# Patient Record
Sex: Male | Born: 1995 | Race: Black or African American | Hispanic: No | Marital: Single | Smoking: Current every day smoker
Health system: Southern US, Community
[De-identification: ages and names within clinical notes are randomized; demographics above are authoritative.]

## PROBLEM LIST (undated history)

## (undated) DIAGNOSIS — R569 Unspecified convulsions: Secondary | ICD-10-CM

---
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
## 2016-02-08 ENCOUNTER — Emergency Department (HOSPITAL_COMMUNITY): Admission: EM | Admit: 2016-02-08 | Discharge: 2016-02-08 | Discharge status: Against Medical Advice | Payer: Self-pay

## 2016-02-08 NOTE — ED Triage Notes (Signed)
No answer x3

## 2016-02-08 NOTE — ED Notes (Signed)
Pt called in waiting room for triage and did not answer

## 2016-03-22 ENCOUNTER — Encounter (HOSPITAL_COMMUNITY): Payer: Self-pay | Admitting: *Deleted

## 2016-03-22 ENCOUNTER — Emergency Department (HOSPITAL_COMMUNITY)
Admission: EM | Admit: 2016-03-22 | Discharge: 2016-03-22 | Disposition: A | Discharge status: Home / Self Care | Payer: Self-pay | Attending: Emergency Medicine | Admitting: Emergency Medicine

## 2016-03-22 DIAGNOSIS — R51 Headache: Secondary | ICD-10-CM | POA: Insufficient documentation

## 2016-03-22 DIAGNOSIS — F17200 Nicotine dependence, unspecified, uncomplicated: Secondary | ICD-10-CM | POA: Insufficient documentation

## 2016-03-22 DIAGNOSIS — R519 Headache, unspecified: Secondary | ICD-10-CM

## 2016-03-22 DIAGNOSIS — K047 Periapical abscess without sinus: Secondary | ICD-10-CM

## 2016-03-22 MED ORDER — PENICILLIN V POTASSIUM 250 MG PO TABS: 500.0000 mg | ORAL_TABLET | Freq: Four times a day (QID) | ORAL | 0 refills | Status: AC

## 2016-03-22 MED ORDER — KETOROLAC TROMETHAMINE 30 MG/ML IJ SOLN
30.0000 mg | Freq: Once | INTRAMUSCULAR | Status: AC
  Administered 2016-03-22: 30 mg via INTRAVENOUS
  Filled 2016-03-22: qty 1

## 2016-03-22 MED ORDER — PENICILLIN V POTASSIUM 250 MG PO TABS
500.0000 mg | ORAL_TABLET | Freq: Once | ORAL | Status: AC
  Administered 2016-03-22: 500 mg via ORAL
  Filled 2016-03-22: qty 2

## 2016-03-22 MED ORDER — METOCLOPRAMIDE HCL 5 MG/ML IJ SOLN
10.0000 mg | Freq: Once | INTRAMUSCULAR | Status: AC
  Administered 2016-03-22: 10 mg via INTRAVENOUS
  Filled 2016-03-22: qty 2

## 2016-03-22 MED ORDER — SODIUM CHLORIDE 0.9 % IV BOLUS (SEPSIS)
1000.0000 mL | Freq: Once | INTRAVENOUS | Status: AC
  Administered 2016-03-22: 1000 mL via INTRAVENOUS

## 2016-03-22 MED ORDER — DIPHENHYDRAMINE HCL 50 MG/ML IJ SOLN
12.5000 mg | Freq: Once | INTRAMUSCULAR | Status: AC
  Administered 2016-03-22: 12.5 mg via INTRAVENOUS
  Filled 2016-03-22: qty 1

## 2016-03-22 NOTE — ED Provider Notes (Signed)
[facility] Provider Note   CSN: [id number] Arrival date & time: 03/22/16  0016     History   Chief Complaint Chief Complaint  Patient presents with  . Headache    HPI Brett Vazquez is a 21 y.o. male.  The history is provided by the patient and medical records.  Headache   Associated symptoms include nausea and vomiting. Pertinent negatives include no fever and no shortness of breath.   Brett Vazquez is a 21 y.o. male  with a PMH of seizures who presents to the Emergency Department complaining of gradually worsening headache that began around 3-4 pm associated with nausea and four episodes of emesis. Patient states that he has had a left lower tooth ache x 2-3 days as well. He tried to take a leftover antibiotic today, but threw it up shortly after. No other medications prior to arrival.  No shortness of breath, difficulty swallowing, fever/chills, neck pain, visual changes, muscle weakness. No head injury.   Past Medical History:  Diagnosis Date  . Seizures (HCC)     There are no active problems to display for this patient.   History reviewed. No pertinent surgical history.     Home Medications    Prior to Admission medications   Medication Sig Start Date End Date Taking? Authorizing Provider  acetaminophen (TYLENOL) 160 MG/5ML liquid Take 325 mg by mouth every 4 (four) hours as needed for fever.    Historical Provider, MD  cetirizine (ZYRTEC ALLERGY) 10 MG tablet Take 1 tablet (10 mg total) by mouth daily. 04/04/15   Everlene FarrierWilliam Dansie, PA-C  cyclobenzaprine (FLEXERIL) 5 MG tablet 1 tab po q8h prn muscle pain 11/17/13   Viviano SimasLauren Robinson, NP  diazepam (DIASTAT ACUDIAL) 10 MG GEL Place 10 mg rectally once. For seizure lasting more than 3 minutes 07/06/14   Ree ShayJamie Deis, MD  ibuprofen (ADVIL,MOTRIN) 600 MG tablet Take 1 tablet (600 mg total) by mouth every 6 (six) hours as needed. Patient taking differently: Take 600 mg by mouth every 6 (six) hours as needed for  moderate pain.  12/25/14   Eyvonne MechanicJeffrey Hedges, PA-C  naproxen (NAPROSYN) 250 MG tablet Take 1 tablet (250 mg total) by mouth 2 (two) times daily with a meal. 04/04/15   Everlene FarrierWilliam Dansie, PA-C  ondansetron (ZOFRAN ODT) 4 MG disintegrating tablet Take 1 tablet (4 mg total) by mouth every 8 (eight) hours as needed for nausea or vomiting. 04/04/15   Everlene FarrierWilliam Dansie, PA-C  oseltamivir (TAMIFLU) 75 MG capsule Take 1 capsule (75 mg total) by mouth every 12 (twelve) hours. 04/04/15   Everlene FarrierWilliam Dansie, PA-C  pantoprazole (PROTONIX) 20 MG tablet Take 1 tablet (20 mg total) by mouth daily. 07/10/15   Samantha Tripp Dowless, PA-C  penicillin v potassium (VEETID) 250 MG tablet Take 2 tablets (500 mg total) by mouth 4 (four) times daily. 03/22/16 03/29/16  Chase PicketJaime Pilcher Kavina Cantave, PA-C    Family History No family history on file.  Social History Social History  Substance Use Topics  . Smoking status: Current Every Day Smoker  . Smokeless tobacco: Never Used  . Alcohol use No     Allergies   Patient has no known allergies.   Review of Systems Review of Systems  Constitutional: Negative for chills and fever.  HENT: Positive for dental problem. Negative for congestion, sore throat and trouble swallowing.   Eyes: Negative for visual disturbance.  Respiratory: Negative for cough and shortness of breath.   Cardiovascular: Negative.   Gastrointestinal: Positive for nausea  and vomiting. Negative for abdominal pain, blood in stool, constipation and diarrhea.  Genitourinary: Negative for dysuria.  Musculoskeletal: Negative for back pain and neck pain.  Skin: Negative for rash.  Neurological: Positive for headaches. Negative for syncope and weakness.     Physical Exam Updated Vital Signs BP 112/68 (BP Location: Left Arm)   Pulse 81   Temp 98.3 F (36.8 C) (Oral)   Resp 16   Ht 5\' 11"  (1.803 m)   Wt 54.4 kg   SpO2 99%   BMI 16.74 kg/m   Physical Exam  Constitutional: He is oriented to person, place, and time.  He appears well-developed and well-nourished. No distress.  HENT:  Head: Normocephalic and atraumatic.  Pain along left lower gum. No abscess appreciated. Midline uvula, no trismus, oropharynx moist and clear, no oropharyngeal erythema or edema, neck supple and no tenderness. No facial edema  Cardiovascular: Normal rate, regular rhythm and normal heart sounds.   No murmur heard. Pulmonary/Chest: Effort normal and breath sounds normal. No respiratory distress.  Abdominal: Soft. He exhibits no distension. There is no tenderness.  Musculoskeletal: He exhibits no edema.  Neurological: He is alert and oriented to person, place, and time.  Alert, oriented, thought content appropriate, able to give a coherent history. Speech is clear and goal oriented, able to follow commands.  Cranial Nerves II-XII grossly intact.  5/5 muscle strength in upper and lower extremities bilaterally including strong and equal grip strength and dorsiflexion/plantar flexion Sensory to light touch normal in all four extremities.  Normal finger-to-nose and rapid alternating movements; normal gait and balance. Negative romberg, no pronator drift.  Skin: Skin is warm and dry.  Nursing note and vitals reviewed.    ED Treatments / Results  Labs (all labs ordered are listed, but only abnormal results are displayed) Labs Reviewed - No data to display  EKG  EKG Interpretation None       Radiology No results found.  Procedures Procedures (including critical care time)  Medications Ordered in ED Medications  sodium chloride 0.9 % bolus 1,000 mL (0 mLs Intravenous Stopped 03/22/16 0240)  ketorolac (TORADOL) 30 MG/ML injection 30 mg (30 mg Intravenous Given 03/22/16 0146)  metoCLOPramide (REGLAN) injection 10 mg (10 mg Intravenous Given 03/22/16 0146)  diphenhydrAMINE (BENADRYL) injection 12.5 mg (12.5 mg Intravenous Given 03/22/16 0147)  penicillin v potassium (VEETID) tablet 500 mg (500 mg Oral Given 03/22/16 0235)      Initial Impression / Assessment and Plan / ED Course  I have reviewed the triage vital signs and the nursing notes.  Pertinent labs & imaging results that were available during my care of the patient were reviewed by me and considered in my medical decision making (see chart for details).    Brett Fruits presents to ED for headache associated with n/v. No focal neuro deficits on exam. Migraine cocktail given. On re-evaluation, patient states headache has resolved.The patient denies any neurologic symptoms such as visual changes, focal numbness/weakness, balance problems, confusion, or speech difficulty to suggest a life-threatening intracranial process such as intracranial hemorrhage or mass. The patient has no clotting risk factors thus venous sinus thrombosis is unlikely.  Patient also complaining of dental pain today. No abscess requiring immediate incision and drainage. Patient is afebrile, non toxic appearing, and swallowing secretions well. Exam not concerning for Ludwig's angina or pharyngeal abscess. Will treat with PenVK. I stressed the importance of dental follow up for ultimate management of dental pain.  I feel that the patient is  safe for discharge home at this time. PCP follow up strongly encouraged. I have reviewed return precautions including development of neurologic symptoms, confusion, lethargy, or difficulty speaking and patient has voiced understanding.    Final Clinical Impressions(s) / ED Diagnoses   Final diagnoses:  Dental infection  Acute nonintractable headache, unspecified headache type    New Prescriptions New Prescriptions   PENICILLIN V POTASSIUM (VEETID) 250 MG TABLET    Take 2 tablets (500 mg total) by mouth 4 (four) times daily.     [facility] Amere Iott, PA-C 03/22/16 1610    Pricilla Loveless, MD 03/24/16 [id number]

## 2016-03-22 NOTE — Discharge Instructions (Signed)
You have a dental injury. It is very important that you get evaluated by a dentist as soon as possible. Call tomorrow to schedule an appointment. Ibuprofen as needed for pain. Take your full course of antibiotics. Read the instructions below. ° °Eat a soft or liquid diet and rinse your mouth out after meals with warm water. You should see a dentist or return here at once if you have increased swelling, increased pain or uncontrolled bleeding from the site of your injury. ° °SEEK MEDICAL CARE IF:  °You have increased pain not controlled with medicines.  °You have swelling around your tooth, in your face or neck.  °You have bleeding which starts, continues, or gets worse.  °You have a fever >101 °If you are unable to open your mouth °

## 2016-03-22 NOTE — ED Triage Notes (Signed)
The pt is c/o a headache with n v all day   He is taking an antibiotic for a dental abscess and he has not been able to keep his meds down

## 2016-07-26 ENCOUNTER — Encounter (HOSPITAL_COMMUNITY): Payer: Self-pay | Admitting: Nurse Practitioner

## 2016-07-26 DIAGNOSIS — H5712 Ocular pain, left eye: Secondary | ICD-10-CM | POA: Insufficient documentation

## 2016-07-26 DIAGNOSIS — F17210 Nicotine dependence, cigarettes, uncomplicated: Secondary | ICD-10-CM | POA: Insufficient documentation

## 2016-07-26 DIAGNOSIS — Z5321 Procedure and treatment not carried out due to patient leaving prior to being seen by health care provider: Secondary | ICD-10-CM | POA: Insufficient documentation

## 2016-07-26 NOTE — ED Triage Notes (Signed)
Patient states he woke up about 3 days ago and left eye had some swelling around it. Over the past 24 hours continues to have a nagging pain, waters at times, and intermittent blurred vision when watering. Denies any recent injuries or anything in eye. Denies headache, dizziness, or visual changes. Some periorbital edema noted, sclera pink in corner.

## 2016-07-27 ENCOUNTER — Emergency Department (HOSPITAL_COMMUNITY)
Admission: EM | Admit: 2016-07-27 | Discharge: 2016-07-27 | Disposition: A | Discharge status: Home / Self Care | Payer: Self-pay | Attending: Emergency Medicine | Admitting: Emergency Medicine

## 2016-07-27 NOTE — ED Notes (Signed)
Patient called to come back to room for tx. No response or sign of patient in lobby.

## 2016-07-27 NOTE — ED Notes (Signed)
Patient called x3 with no response. Will assume LWBS after triage.

## 2016-07-27 NOTE — ED Notes (Signed)
Patient called with no response or presence in lobby.  

## 2017-12-03 ENCOUNTER — Encounter (HOSPITAL_COMMUNITY): Payer: Self-pay

## 2017-12-03 ENCOUNTER — Emergency Department (HOSPITAL_COMMUNITY)
Admission: EM | Admit: 2017-12-03 | Discharge: 2017-12-03 | Disposition: A | Discharge status: Home / Self Care | Payer: Self-pay | Attending: Emergency Medicine | Admitting: Emergency Medicine

## 2017-12-03 ENCOUNTER — Other Ambulatory Visit: Payer: Self-pay

## 2017-12-03 DIAGNOSIS — Z202 Contact with and (suspected) exposure to infections with a predominantly sexual mode of transmission: Secondary | ICD-10-CM | POA: Insufficient documentation

## 2017-12-03 DIAGNOSIS — F17210 Nicotine dependence, cigarettes, uncomplicated: Secondary | ICD-10-CM | POA: Insufficient documentation

## 2017-12-03 DIAGNOSIS — Z79899 Other long term (current) drug therapy: Secondary | ICD-10-CM | POA: Insufficient documentation

## 2017-12-03 MED ORDER — ONDANSETRON 4 MG PO TBDP
4.0000 mg | ORAL_TABLET | Freq: Once | ORAL | Status: AC
  Administered 2017-12-03: 4 mg via ORAL
  Filled 2017-12-03: qty 1

## 2017-12-03 MED ORDER — METRONIDAZOLE 500 MG PO TABS
2000.0000 mg | ORAL_TABLET | Freq: Once | ORAL | Status: AC
  Administered 2017-12-03: 2000 mg via ORAL
  Filled 2017-12-03: qty 4

## 2017-12-03 NOTE — ED Provider Notes (Signed)
[facility] EMERGENCY DEPARTMENT Provider Note   CSN: [id number] Arrival date & time: 12/03/17  1924     History   Chief Complaint No chief complaint on file.   HPI Brett Vazquez is a 22 y.o. male.who presents to the ED for treatment of trichomonas. Patient reports his girlfriend had trichomonas and the provider at [facility] gave him Rx for Flagyl. He reports going to the pharmacy and they told him the Rx was not signed and he would have to come to the ED to get his medication. Patient denies an discharge or symptoms.   HPI  Past Medical History:  Diagnosis Date  . Seizures (HCC)     There are no active problems to display for this patient.   No past surgical history on file.      Home Medications    Prior to Admission medications   Medication Sig Start Date End Date Taking? Authorizing Provider  acetaminophen (TYLENOL) 160 MG/5ML liquid Take 325 mg by mouth every 4 (four) hours as needed for fever.    [provider]  cetirizine (ZYRTEC ALLERGY) 10 MG tablet Take 1 tablet (10 mg total) by mouth daily. 04/04/15   Everlene Farrier, PA-C  cyclobenzaprine (FLEXERIL) 5 MG tablet 1 tab po q8h prn muscle pain 11/17/13   Viviano Simas, NP  diazepam (DIASTAT ACUDIAL) 10 MG GEL Place 10 mg rectally once. For seizure lasting more than 3 minutes 07/06/14   Ree Shay, MD  naproxen (NAPROSYN) 250 MG tablet Take 1 tablet (250 mg total) by mouth 2 (two) times daily with a meal. 04/04/15   Everlene Farrier, PA-C  ondansetron (ZOFRAN ODT) 4 MG disintegrating tablet Take 1 tablet (4 mg total) by mouth every 8 (eight) hours as needed for nausea or vomiting. 04/04/15   Everlene Farrier, PA-C  oseltamivir (TAMIFLU) 75 MG capsule Take 1 capsule (75 mg total) by mouth every 12 (twelve) hours. 04/04/15   Everlene Farrier, PA-C  pantoprazole (PROTONIX) 20 MG tablet Take 1 tablet (20 mg total) by mouth daily. 07/10/15   Dowless, Lester Kinsman, PA-C    Family  History No family history on file.  Social History Social History   Tobacco Use  . Smoking status: Current Every Day Smoker    Packs/day: 0.50    Types: Cigarettes  . Smokeless tobacco: Never Used  Substance Use Topics  . Alcohol use: No  . Drug use: No     Allergies   Patient has no known allergies.   Review of Systems Review of Systems  Genitourinary: Negative for dysuria, genital sores and urgency.  All other systems reviewed and are negative.    Physical Exam Updated Vital Signs BP 95/79   Pulse 90   Temp 98.1 F (36.7 C) (Oral)   Resp 18   SpO2 100%   Physical Exam  Constitutional: He appears well-developed and well-nourished. No distress.  HENT:  Head: Normocephalic.  Eyes: EOM are normal.  Neck: Neck supple.  Cardiovascular: Normal rate.  Pulmonary/Chest: Effort normal.  Musculoskeletal: Normal range of motion.  Neurological: He is alert.  Skin: Skin is warm and dry.  Psychiatric: He has a normal mood and affect.  Nursing note and vitals reviewed.    ED Treatments / Results  Labs (all labs ordered are listed, but only abnormal results are displayed) Labs Reviewed - No data to display  EKG None  Radiology No results found.  Procedures Procedures (including critical care time)  Medications Ordered in ED  Medications  metroNIDAZOLE (FLAGYL) tablet 2,000 mg (has no administration in time range)  ondansetron (ZOFRAN-ODT) disintegrating tablet 4 mg (has no administration in time range)     Initial Impression / Assessment and Plan / ED Course  I have reviewed the triage vital signs and the nursing notes. 22 y.o. male here for treatment for trichomonas after his girlfriend was treated at [facility] and he could not get the Rx filled. Stable for d/c with out any problems. Encouraged patient to f/u with [facility] for additional screening.   Final Clinical Impressions(s) / ED Diagnoses   Final diagnoses:  Exposure to STD    ED Discharge Orders      None       Kerrie Buffalo [place], [place] 12/03/17 2028    Clarene Duke Ambrose Finland, MD 12/06/17 1020

## 2017-12-03 NOTE — ED Notes (Signed)
Patient verbalizes understanding of discharge instructions. Opportunity for questioning and answers were provided. Armband removed by staff, pt discharged from ED home via POV.  

## 2017-12-03 NOTE — ED Triage Notes (Signed)
Pt here after getting a prescription to be treated for trichomonas and the pharmacy could not fill it.  No new complaints.  A&Ox4
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
## 2017-12-03 NOTE — Discharge Instructions (Addendum)
Go to the health department for additional screening and treatment.

## 2018-03-03 ENCOUNTER — Encounter (HOSPITAL_COMMUNITY): Payer: Self-pay | Admitting: *Deleted

## 2018-03-03 ENCOUNTER — Other Ambulatory Visit: Payer: Self-pay

## 2018-03-03 ENCOUNTER — Emergency Department (HOSPITAL_COMMUNITY)
Admission: EM | Admit: 2018-03-03 | Discharge: 2018-03-04 | Disposition: A | Discharge status: Home / Self Care | Payer: Self-pay | Attending: Emergency Medicine | Admitting: Emergency Medicine

## 2018-03-03 DIAGNOSIS — Y929 Unspecified place or not applicable: Secondary | ICD-10-CM | POA: Insufficient documentation

## 2018-03-03 DIAGNOSIS — Z5321 Procedure and treatment not carried out due to patient leaving prior to being seen by health care provider: Secondary | ICD-10-CM | POA: Insufficient documentation

## 2018-03-03 DIAGNOSIS — Y939 Activity, unspecified: Secondary | ICD-10-CM | POA: Insufficient documentation

## 2018-03-03 DIAGNOSIS — Y999 Unspecified external cause status: Secondary | ICD-10-CM | POA: Insufficient documentation
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
## 2018-03-04 ENCOUNTER — Encounter (HOSPITAL_COMMUNITY): Payer: Self-pay | Admitting: *Deleted

## 2018-03-04 ENCOUNTER — Other Ambulatory Visit: Payer: Self-pay

## 2018-03-04 ENCOUNTER — Emergency Department (HOSPITAL_COMMUNITY)
Admission: EM | Admit: 2018-03-04 | Discharge: 2018-03-05 | Disposition: A | Discharge status: Home / Self Care | Payer: Self-pay | Attending: Emergency Medicine | Admitting: Emergency Medicine

## 2018-03-04 DIAGNOSIS — S0181XA Laceration without foreign body of other part of head, initial encounter: Secondary | ICD-10-CM | POA: Insufficient documentation

## 2018-03-04 DIAGNOSIS — R51 Headache: Secondary | ICD-10-CM | POA: Insufficient documentation

## 2018-03-04 DIAGNOSIS — S0181XD Laceration without foreign body of other part of head, subsequent encounter: Secondary | ICD-10-CM

## 2018-03-04 DIAGNOSIS — S022XXA Fracture of nasal bones, initial encounter for closed fracture: Secondary | ICD-10-CM | POA: Insufficient documentation

## 2018-03-04 DIAGNOSIS — S0083XD Contusion of other part of head, subsequent encounter: Secondary | ICD-10-CM

## 2018-03-04 DIAGNOSIS — Z5320 Procedure and treatment not carried out because of patient's decision for unspecified reasons: Secondary | ICD-10-CM | POA: Insufficient documentation

## 2018-03-04 DIAGNOSIS — Y929 Unspecified place or not applicable: Secondary | ICD-10-CM | POA: Insufficient documentation

## 2018-03-04 DIAGNOSIS — S0083XA Contusion of other part of head, initial encounter: Secondary | ICD-10-CM | POA: Insufficient documentation

## 2018-03-04 DIAGNOSIS — F17210 Nicotine dependence, cigarettes, uncomplicated: Secondary | ICD-10-CM | POA: Insufficient documentation

## 2018-03-04 DIAGNOSIS — Z79899 Other long term (current) drug therapy: Secondary | ICD-10-CM | POA: Insufficient documentation

## 2018-03-04 DIAGNOSIS — H538 Other visual disturbances: Secondary | ICD-10-CM | POA: Insufficient documentation

## 2018-03-04 DIAGNOSIS — Y999 Unspecified external cause status: Secondary | ICD-10-CM | POA: Insufficient documentation

## 2018-03-04 DIAGNOSIS — Z23 Encounter for immunization: Secondary | ICD-10-CM | POA: Insufficient documentation

## 2018-03-04 DIAGNOSIS — Y939 Activity, unspecified: Secondary | ICD-10-CM | POA: Insufficient documentation
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
## 2018-03-04 NOTE — ED Notes (Signed)
Pt released by [employer] to be seen, then pt was seen leaving ED lobby with family.

## 2018-03-04 NOTE — ED Notes (Signed)
Per [employer] officer, the pt left and does not want to be seen.

## 2018-03-05 ENCOUNTER — Emergency Department (HOSPITAL_COMMUNITY): Payer: Self-pay

## 2018-03-05 MED ORDER — ACETAMINOPHEN 500 MG PO TABS
1000.0000 mg | ORAL_TABLET | Freq: Once | ORAL | Status: AC
  Administered 2018-03-05: 1000 mg via ORAL
  Filled 2018-03-05: qty 2

## 2018-03-05 MED ORDER — KETOROLAC TROMETHAMINE 60 MG/2ML IM SOLN
60.0000 mg | Freq: Once | INTRAMUSCULAR | Status: AC
  Administered 2018-03-05: 60 mg via INTRAMUSCULAR
  Filled 2018-03-05: qty 2

## 2018-03-05 MED ORDER — TETANUS-DIPHTH-ACELL PERTUSSIS 5-2.5-18.5 LF-MCG/0.5 IM SUSP
0.5000 mL | Freq: Once | INTRAMUSCULAR | Status: AC
  Administered 2018-03-05: 0.5 mL via INTRAMUSCULAR
  Filled 2018-03-05: qty 0.5
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
## 2018-03-05 NOTE — ED Notes (Signed)
Patient was told EDP would be in to see him shortly , states "I am not waiting any longer

## 2018-03-31 ENCOUNTER — Other Ambulatory Visit: Payer: Self-pay

## 2018-03-31 ENCOUNTER — Emergency Department (HOSPITAL_COMMUNITY)
Admission: EM | Admit: 2018-03-31 | Discharge: 2018-04-01 | Disposition: A | Discharge status: Home / Self Care | Payer: Self-pay | Attending: Emergency Medicine | Admitting: Emergency Medicine

## 2018-03-31 DIAGNOSIS — Y9389 Activity, other specified: Secondary | ICD-10-CM | POA: Insufficient documentation

## 2018-03-31 DIAGNOSIS — H1132 Conjunctival hemorrhage, left eye: Secondary | ICD-10-CM | POA: Insufficient documentation

## 2018-03-31 DIAGNOSIS — F17210 Nicotine dependence, cigarettes, uncomplicated: Secondary | ICD-10-CM | POA: Insufficient documentation

## 2018-03-31 DIAGNOSIS — Y929 Unspecified place or not applicable: Secondary | ICD-10-CM | POA: Insufficient documentation

## 2018-03-31 DIAGNOSIS — S0240DA Maxillary fracture, left side, initial encounter for closed fracture: Secondary | ICD-10-CM | POA: Insufficient documentation

## 2018-03-31 DIAGNOSIS — S0240FA Zygomatic fracture, left side, initial encounter for closed fracture: Secondary | ICD-10-CM | POA: Insufficient documentation

## 2018-03-31 DIAGNOSIS — Y999 Unspecified external cause status: Secondary | ICD-10-CM | POA: Insufficient documentation

## 2018-03-31 DIAGNOSIS — S022XXA Fracture of nasal bones, initial encounter for closed fracture: Secondary | ICD-10-CM | POA: Insufficient documentation
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
## 2018-04-01 ENCOUNTER — Emergency Department (HOSPITAL_COMMUNITY): Payer: Self-pay

## 2018-04-01 MED ORDER — OXYCODONE-ACETAMINOPHEN 5-325 MG PO TABS: 1.0000 | ORAL_TABLET | Freq: Four times a day (QID) | ORAL | 0 refills | Status: DC | PRN

## 2018-04-01 MED ORDER — ACETAMINOPHEN 500 MG PO TABS
1000.0000 mg | ORAL_TABLET | Freq: Once | ORAL | Status: AC
  Administered 2018-04-01: 1000 mg via ORAL
  Filled 2018-04-01: qty 2

## 2018-04-01 MED ORDER — MORPHINE SULFATE (PF) 4 MG/ML IV SOLN
4.0000 mg | Freq: Once | INTRAVENOUS | Status: AC
  Administered 2018-04-01: 4 mg via INTRAMUSCULAR
  Filled 2018-04-01: qty 1

## 2018-04-01 NOTE — Discharge Instructions (Signed)
Thank you for allowing me to care for you today in the Emergency Department.   Your pain medication has been called into your pharmacy.  You may take 1 tablet every 6 hours as needed for severe pain.  Do not work or drive while taking this medication because it may make you drowsy or impaired.  You may also take 600 mg of ibuprofen with food or 650 mg of Tylenol once every 6 hours for pain control in addition to Percocet.  Do not take 4000 g of Tylenol from all sources in a 24-hour period.  You may apply an ice pack for 15 to 20 minutes to areas that are sore or swollen to help with pain and swelling.  You need to avoid blowing your nose until your fracture is healed because this may worsen your symptoms.  Call to schedule follow-up appointment tomorrow.  Return to the emergency department if you have another injury, if you become unable to move your eye, if you develop high fever, if you lose vision in one or both eyes, or other new, concerning symptoms.
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
## 2018-05-04 ENCOUNTER — Other Ambulatory Visit: Payer: Self-pay

## 2018-05-04 ENCOUNTER — Encounter (HOSPITAL_COMMUNITY): Payer: Self-pay | Admitting: Emergency Medicine

## 2018-05-04 ENCOUNTER — Emergency Department (HOSPITAL_COMMUNITY)
Admission: EM | Admit: 2018-05-04 | Discharge: 2018-05-05 | Disposition: A | Discharge status: Home / Self Care | Payer: Self-pay | Attending: Emergency Medicine | Admitting: Emergency Medicine

## 2018-05-04 DIAGNOSIS — F17210 Nicotine dependence, cigarettes, uncomplicated: Secondary | ICD-10-CM | POA: Insufficient documentation

## 2018-05-04 DIAGNOSIS — R112 Nausea with vomiting, unspecified: Secondary | ICD-10-CM | POA: Insufficient documentation

## 2018-05-04 LAB — URINALYSIS, ROUTINE W REFLEX MICROSCOPIC
Bilirubin Urine: NEGATIVE
Hgb urine dipstick: NEGATIVE
Ketones, ur: NEGATIVE mg/dL
Leukocytes,Ua: NEGATIVE
NITRITE: NEGATIVE
PH: 6 (ref 5.0–8.0)
PROTEIN: NEGATIVE mg/dL
Specific Gravity, Urine: 1.015 (ref 1.005–1.030)

## 2018-05-04 LAB — LIPASE, BLOOD: Lipase: 28 U/L (ref 11–51)

## 2018-05-04 LAB — COMPREHENSIVE METABOLIC PANEL
ALT: 55 U/L — ABNORMAL HIGH (ref 0–44)
AST: 59 U/L — ABNORMAL HIGH (ref 15–41)
Albumin: 4.1 g/dL (ref 3.5–5.0)
Alkaline Phosphatase: 52 U/L (ref 38–126)
Anion gap: 12 (ref 5–15)
BUN: 7 mg/dL (ref 6–20)
CO2: 27 mmol/L (ref 22–32)
Calcium: 9.2 mg/dL (ref 8.9–10.3)
Chloride: 102 mmol/L (ref 98–111)
Creatinine, Ser: 0.98 mg/dL (ref 0.61–1.24)
GFR calc non Af Amer: >60 mL/min (ref >60)
Glucose, Bld: 78 mg/dL (ref 70–99)
Potassium: 3.6 mmol/L (ref 3.5–5.1)
Sodium: 141 mmol/L (ref 135–145)
Total Bilirubin: 0.5 mg/dL (ref 0.3–1.2)
Total Protein: 6.6 g/dL (ref 6.5–8.1)

## 2018-05-04 LAB — CBC
HCT: 43.3 % (ref 39.0–52.0)
Hemoglobin: 14.2 g/dL (ref 13.0–17.0)
MCH: 31.3 pg (ref 26.0–34.0)
MCHC: 32.8 g/dL (ref 30.0–36.0)
MCV: 95.6 fL (ref 80.0–100.0)
Platelets: 260 10*3/uL (ref 150–400)
RBC: 4.53 MIL/uL (ref 4.22–5.81)
RDW: 12.2 % (ref 11.5–15.5)
WBC: 13.7 10*3/uL — ABNORMAL HIGH (ref 4.0–10.5)
nRBC: 0 % (ref 0.0–0.2)

## 2018-05-04 MED ORDER — ONDANSETRON 4 MG PO TBDP: 4.0000 mg | ORAL_TABLET | Freq: Three times a day (TID) | ORAL | 0 refills | Status: DC | PRN

## 2018-05-04 MED ORDER — SODIUM CHLORIDE 0.9% FLUSH: 3.0000 mL | Freq: Once | INTRAVENOUS | Status: DC

## 2018-05-04 MED ORDER — ONDANSETRON HCL 4 MG/2ML IJ SOLN
4.0000 mg | Freq: Once | INTRAMUSCULAR | Status: AC
  Administered 2018-05-04: 4 mg via INTRAVENOUS
  Filled 2018-05-04: qty 2

## 2018-05-04 MED ORDER — SODIUM CHLORIDE 0.9 % IV BOLUS
1000.0000 mL | Freq: Once | INTRAVENOUS | Status: AC
  Administered 2018-05-04: 1000 mL via INTRAVENOUS

## 2018-05-04 MED ORDER — KETOROLAC TROMETHAMINE 30 MG/ML IJ SOLN
15.0000 mg | Freq: Once | INTRAMUSCULAR | Status: AC
  Administered 2018-05-04: 15 mg via INTRAVENOUS
  Filled 2018-05-04: qty 1

## 2018-05-04 NOTE — Discharge Instructions (Signed)
Do not take medications that are not prescribed to you.  You may continue using Zofran as needed for management of nausea.  Drink clear liquids to prevent dehydration.  Return to the emergency department if you develop worsening symptoms such as increased vomiting, severe abdominal pain, fever, or other concerning symptoms.
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
## 2018-05-04 NOTE — ED Notes (Signed)
ED Provider at bedside. 

## 2018-05-04 NOTE — ED Triage Notes (Signed)
Pt states he this he took "a fake percocet" and has been repeatedly vomiting since
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
## 2022-10-07 ENCOUNTER — Encounter (HOSPITAL_COMMUNITY): Payer: Self-pay | Admitting: Emergency Medicine

## 2022-10-07 ENCOUNTER — Ambulatory Visit (HOSPITAL_COMMUNITY)
Admission: EM | Admit: 2022-10-07 | Discharge: 2022-10-08 | Disposition: A | Discharge status: Other Acute Inpatient Hospital | Payer: 59 | Attending: Psychiatry | Admitting: Psychiatry

## 2022-10-07 ENCOUNTER — Other Ambulatory Visit: Payer: Self-pay

## 2022-10-07 DIAGNOSIS — F1320 Sedative, hypnotic or anxiolytic dependence, uncomplicated: Secondary | ICD-10-CM | POA: Diagnosis not present

## 2022-10-07 DIAGNOSIS — F1290 Cannabis use, unspecified, uncomplicated: Secondary | ICD-10-CM

## 2022-10-07 DIAGNOSIS — E876 Hypokalemia: Secondary | ICD-10-CM | POA: Insufficient documentation

## 2022-10-07 DIAGNOSIS — F1210 Cannabis abuse, uncomplicated: Secondary | ICD-10-CM | POA: Insufficient documentation

## 2022-10-07 DIAGNOSIS — F1110 Opioid abuse, uncomplicated: Secondary | ICD-10-CM | POA: Diagnosis not present

## 2022-10-07 DIAGNOSIS — R441 Visual hallucinations: Secondary | ICD-10-CM | POA: Diagnosis present

## 2022-10-07 DIAGNOSIS — F1123 Opioid dependence with withdrawal: Secondary | ICD-10-CM | POA: Diagnosis not present

## 2022-10-07 DIAGNOSIS — R9431 Abnormal electrocardiogram [ECG] [EKG]: Secondary | ICD-10-CM | POA: Diagnosis not present

## 2022-10-07 LAB — HEMOGLOBIN A1C
Hgb A1c MFr Bld: 5.4 % (ref 4.8–5.6)
Mean Plasma Glucose: 108.28 mg/dL

## 2022-10-07 LAB — CBC WITH DIFFERENTIAL/PLATELET
Abs Immature Granulocytes: 0.01 10*3/uL (ref 0.00–0.07)
Basophils Absolute: 0 10*3/uL (ref 0.0–0.1)
Basophils Relative: 1 %
Eosinophils Absolute: 0.1 10*3/uL (ref 0.0–0.5)
Eosinophils Relative: 1 %
HCT: 43.3 % (ref 39.0–52.0)
Hemoglobin: 14.6 g/dL (ref 13.0–17.0)
Immature Granulocytes: 0 %
Lymphocytes Relative: 32 %
Lymphs Abs: 2 10*3/uL (ref 0.7–4.0)
MCH: 29.7 pg (ref 26.0–34.0)
MCHC: 33.7 g/dL (ref 30.0–36.0)
MCV: 88 fL (ref 80.0–100.0)
Monocytes Absolute: 0.7 10*3/uL (ref 0.1–1.0)
Monocytes Relative: 10 %
Neutro Abs: 3.5 10*3/uL (ref 1.7–7.7)
Neutrophils Relative %: 56 %
Platelets: 281 10*3/uL (ref 150–400)
RBC: 4.92 MIL/uL (ref 4.22–5.81)
RDW: 12.8 % (ref 11.5–15.5)
WBC: 6.2 10*3/uL (ref 4.0–10.5)
nRBC: 0 % (ref 0.0–0.2)

## 2022-10-07 LAB — POCT URINE DRUG SCREEN - MANUAL ENTRY (I-SCREEN)
POC Amphetamine UR: NOT DETECTED
POC Buprenorphine (BUP): NOT DETECTED
POC Cocaine UR: NOT DETECTED
POC Marijuana UR: POSITIVE — AB
POC Methadone UR: NOT DETECTED
POC Methamphetamine UR: NOT DETECTED
POC Morphine: NOT DETECTED
POC Oxazepam (BZO): POSITIVE — AB
POC Oxycodone UR: POSITIVE — AB
POC Secobarbital (BAR): NOT DETECTED

## 2022-10-07 LAB — COMPREHENSIVE METABOLIC PANEL
ALT: 18 U/L (ref 0–44)
AST: 18 U/L (ref 15–41)
Albumin: 4.2 g/dL (ref 3.5–5.0)
Alkaline Phosphatase: 53 U/L (ref 38–126)
Anion gap: 12 (ref 5–15)
BUN: <5 mg/dL — ABNORMAL LOW (ref 6–20)
CO2: 28 mmol/L (ref 22–32)
Calcium: 9.9 mg/dL (ref 8.9–10.3)
Chloride: 95 mmol/L — ABNORMAL LOW (ref 98–111)
Creatinine, Ser: 1.03 mg/dL (ref 0.61–1.24)
GFR, Estimated: >60 mL/min (ref >60)
Glucose, Bld: 90 mg/dL (ref 70–99)
Potassium: 2.9 mmol/L — ABNORMAL LOW (ref 3.5–5.1)
Sodium: 135 mmol/L (ref 135–145)
Total Bilirubin: 0.4 mg/dL (ref 0.3–1.2)
Total Protein: 7.4 g/dL (ref 6.5–8.1)

## 2022-10-07 LAB — TSH: TSH: 0.45 u[IU]/mL (ref 0.350–4.500)

## 2022-10-07 LAB — LIPID PANEL
Cholesterol: 112 mg/dL (ref 0–200)
HDL: 40 mg/dL — ABNORMAL LOW (ref >40)
LDL Cholesterol: 61 mg/dL (ref 0–99)
Total CHOL/HDL Ratio: 2.8 RATIO
Triglycerides: 57 mg/dL (ref <150)
VLDL: 11 mg/dL (ref 0–40)

## 2022-10-07 LAB — ETHANOL: Alcohol, Ethyl (B): <10 mg/dL (ref <10)

## 2022-10-07 MED ORDER — ONDANSETRON 4 MG PO TBDP: 4.0000 mg | ORAL_TABLET | Freq: Four times a day (QID) | ORAL | Status: DC | PRN

## 2022-10-07 MED ORDER — LOPERAMIDE HCL 2 MG PO CAPS: 2.0000 mg | ORAL_CAPSULE | ORAL | Status: DC | PRN

## 2022-10-07 MED ORDER — ACETAMINOPHEN 325 MG PO TABS
650.0000 mg | ORAL_TABLET | Freq: Four times a day (QID) | ORAL | Status: DC | PRN
  Administered 2022-10-07: 650 mg via ORAL
  Filled 2022-10-07: qty 2

## 2022-10-07 MED ORDER — MAGNESIUM HYDROXIDE 400 MG/5ML PO SUSP: 30.0000 mL | Freq: Every day | ORAL | Status: DC | PRN

## 2022-10-07 MED ORDER — ADULT MULTIVITAMIN W/MINERALS CH
1.0000 | ORAL_TABLET | Freq: Every day | ORAL | Status: DC
  Administered 2022-10-07 – 2022-10-08 (×2): 1 via ORAL
  Filled 2022-10-07 (×2): qty 1

## 2022-10-07 MED ORDER — TRAZODONE HCL 50 MG PO TABS
50.0000 mg | ORAL_TABLET | Freq: Every evening | ORAL | Status: DC | PRN
  Administered 2022-10-07: 50 mg via ORAL
  Filled 2022-10-07: qty 1

## 2022-10-07 MED ORDER — METHOCARBAMOL 500 MG PO TABS: 500.0000 mg | ORAL_TABLET | Freq: Three times a day (TID) | ORAL | Status: DC | PRN

## 2022-10-07 MED ORDER — HYDROXYZINE HCL 25 MG PO TABS: 25.0000 mg | ORAL_TABLET | Freq: Four times a day (QID) | ORAL | Status: DC | PRN

## 2022-10-07 MED ORDER — NAPROXEN 500 MG PO TABS: 500.0000 mg | ORAL_TABLET | Freq: Two times a day (BID) | ORAL | Status: DC | PRN

## 2022-10-07 MED ORDER — LORAZEPAM 1 MG PO TABS
1.0000 mg | ORAL_TABLET | ORAL | Status: AC
  Administered 2022-10-07: 1 mg via ORAL
  Filled 2022-10-07: qty 1

## 2022-10-07 MED ORDER — ALUM & MAG HYDROXIDE-SIMETH 200-200-20 MG/5ML PO SUSP: 30.0000 mL | ORAL | Status: DC | PRN

## 2022-10-07 MED ORDER — LORAZEPAM 1 MG PO TABS: 1.0000 mg | ORAL_TABLET | ORAL | Status: DC | PRN

## 2022-10-07 MED ORDER — DICYCLOMINE HCL 20 MG PO TABS: 20.0000 mg | ORAL_TABLET | Freq: Four times a day (QID) | ORAL | Status: DC | PRN

## 2022-10-07 NOTE — BH Assessment (Signed)
Comprehensive Clinical Assessment (CCA) Note  10/08/2022 Brett Vazquez [id number]  Disposition: Brett Bridegroom, NP recommends pt to be admitted to [facility] for Continuous Assessment.   The patient demonstrates the following risk factors for suicide: Chronic risk factors for suicide include: substance use disorder. Acute risk factors for suicide include: N/A. Protective factors for this patient include: positive social support and Pt denies, SI . Considering these factors, the overall suicide risk at this point appears to be no risk. Patient is not appropriate for outpatient follow up.  Brett Vazquez is a 27 year old male who presents voluntary and unaccompanied to [facility]. Clinician asked the pt, "what brought you to the hospital?" Pt reports, he's not been feeling like himself. Pt reports, he's paranoid, feels that people are watching him. Pt reports, he's concerned for his family's safety; he feels they're at risk of being harmed and people are around his house. Pt reports, he just started having panic attack this week, his most recent panic attack was yesterday (10/06/2022). Pt denies, SI, HI, self-injurious behaviors and access to weapons.   Pt reports, taking 2 mg of Xanax, yesterday (10/06/2022). Pt reports, taking .2-.3 mg IV of Fentanyl, yesterday. Pt reports, smoking one or two blunts, yesterday. Pt reports, three years ago if he took too many or not enough Xanax he would have seizures. Pt's UDS is positive for Oxazepam, Oxycodone and Marijuana. Pt denies, being linked to OPT resources (medication management and/or counseling.)   Pt presents disheveled, with normal speech. Pt's mood was depressed, anxious. Pt's affect was congruent. Pt's insight and judgement was fair.  Chief Complaint:  Chief Complaint  Patient presents with   Evaluation   Visit Diagnosis:  Opioid use disorder, severe.   CCA Screening, Triage and Referral (STR)  Patient Reported Information How did you  hear about [place]? Legal System  What Is the Reason for Your Visit/Call Today? Pt arrived to [facility] voluntarily via [employer]. Pt states that he just wasn't feeling like himself today. Per [employer], pt called teling them that people were outside his house. Per [employer], pt called them at least 5 times back to back. Per [employer], mom was at the home when he was calling about people being outside. Per [employer], mom stated that the pt had been tweaking out on drugs all day. Pt denies SI, HI, AVH and alcohol at this current times. Pt endorses using marijuana (2 grams) and xanax (2 footballs) on yesterday.  How Long Has This Been Causing You Problems? <Week  What Do You Feel Would Help You the Most Today? Social Support   Have You Recently Had Any Thoughts About Hurting Yourself? No  Are You Planning to Commit Suicide/Harm Yourself At This time? No   Flowsheet Row ED from 10/07/2022 in [facility]  C-SSRS RISK CATEGORY No Risk       Have you Recently Had Thoughts About Hurting Someone Brett Vazquez? No  Are You Planning to Harm Someone at This Time? No  Explanation: Pt denies, HI.   Have You Used Any Alcohol or Drugs in the Past 24 Hours? Yes  What Did You Use and How Much? smoked marijuana (2 grams) and xanax (2 footballs)   Do You Currently Have a Therapist/Psychiatrist? No  Name of Therapist/Psychiatrist: Name of Therapist/Psychiatrist: Pt denies, being linked to outpatient treatment.   Have You Been Recently Discharged From Any Office Practice or Programs? No  Explanation of Discharge From Practice/Program: None.     CCA Screening Triage  Referral Assessment Type of Contact: Face-to-Face  Telemedicine Service Delivery:   Is this Initial or Reassessment?   Date Telepsych consult ordered in [facility]:    Time Telepsych consult ordered in [facility]:    Location of Assessment: [facility] Assessment Services  Provider Location: [facility] Assessment Services   Collateral Involvement: None.   Does  Patient Have a Automotive engineer Guardian? No  Legal Guardian Contact Information: Pt is his own guardian.  Copy of Legal Guardianship Form: -- (Pt is his own guardian.)  Legal Guardian Notified of Arrival: -- (Pt is his own guardian.)  Legal Guardian Notified of Pending Discharge: -- (Pt is his own guardian.)  If Minor and Not Living with Parent(s), Who has Custody? Pt is an adult.  Is CPS involved or ever been involved? Never  Is APS involved or ever been involved? Never   Patient Determined To Be At Risk for Harm To Self or Others Based on Review of Patient Reported Information or Presenting Complaint? No  Method: No Plan  Availability of Means: No access or NA  Intent: Vague intent or NA  Notification Required: No need or identified person  Additional Information for Danger to Others Potential: -- (Pt denies, HI.)  Additional Comments for Danger to Others Potential: Pt denies, HI.  Are There Guns or Other Weapons in Your Home? No  Types of Guns/Weapons: Pt denies, access to weapons.  Are These Weapons Safely Secured?                            -- (Pt is his own guardian.)  Who Could Verify You Are Able To Have These Secured: Pt is his own guardian.  Do You Have any Outstanding Charges, Pending Court Dates, Parole/Probation? Pt denies, legal involvement.  Contacted To Inform of Risk of Harm To Self or Others: Other: Comment (None.)    Does Patient Present under Involuntary Commitment? No    [place] of Residence: [place]   Patient Currently Receiving the Following Services: Not Receiving Services   Determination of Need: Urgent (48 hours)   Options For Referral: Outpatient Therapy; [facility] Urgent Care; Facility-Based Crisis     CCA Biopsychosocial Patient Reported Schizophrenia/Schizoaffective Diagnosis in Past: No   Strengths: Pt has family, friend supports.   Mental Health Symptoms Depression:   Fatigue; Sleep (too much or little);  Worthlessness; Hopelessness; Irritability; Increase/decrease in appetite; Difficulty Concentrating (Isolation.)   Duration of Depressive symptoms:  Duration of Depressive Symptoms: Greater than two weeks   Mania:   None   Anxiety:    Difficulty concentrating; Irritability; Fatigue; Restlessness; Worrying   Psychosis:   -- (Paranoia.)   Duration of Psychotic symptoms:    Trauma:   None   Obsessions:   None   Compulsions:   None   Inattention:   None   Hyperactivity/Impulsivity:   Feeling of restlessness   Oppositional/Defiant Behaviors:   Angry   Emotional Irregularity:   None   Other Mood/Personality Symptoms:   Depression and anxiety symptoms.    Mental Status Exam Appearance and self-care  Stature:   Average   Weight:   Average weight   Clothing:   Disheveled   Grooming:   Neglected   Cosmetic use:   None   Posture/gait:   Normal   Motor activity:   Not Remarkable   Sensorium  Attention:   Normal   Concentration:   Normal   Orientation:   X5  Recall/memory:  Normal   Affect and Mood  Affect:  Congruent   Mood:  Depressed; Anxious   Relating  Eye contact:   Normal   Facial expression:   Responsive   Attitude toward examiner:  Cooperative   Thought and Language  Speech flow:  Normal   Thought content:   Appropriate to Mood and Circumstances   Preoccupation:   None   Hallucinations:   -- (Paranoia.)   Organization:   Coherent   [employer] of Knowledge:   Fair   Intelligence:   Average   Abstraction:   Functional   Judgement:   Fair   Dance movement psychotherapist:   Realistic   Insight:   Fair   Decision Making:   Impulsive   Social Functioning  Social Maturity:   Impulsive; Isolates   Social Judgement:   "Street Smart"   Stress  Stressors:   Other (Comment) (Pt reports, his family's safety, (he feels his family is at risk of being harmed.) Pt reports, he feels like people are  around his house.)   Coping Ability:   Exhausted   Skill Deficits:   Decision making; Self-care   Supports:   Family     Religion: Religion/Spirituality Are You A Religious Person?: No How Might This Affect Treatment?: None.  Leisure/Recreation: Leisure / Recreation Do You Have Hobbies?: No  Exercise/Diet: Exercise/Diet Do You Exercise?: No Have You Gained or Lost A Significant Amount of Weight in the Past Six Months?: No Do You Follow a Special Diet?: No Do You Have Any Trouble Sleeping?: Yes Explanation of Sleeping Difficulties: Pt reports, he has not been sleep since yesterday.   CCA Employment/Education Employment/Work Situation: Employment / Work Situation Employment Situation: Unemployed Patient's Job has Been Impacted by Current Illness: No Has Patient ever Been in Equities trader?: No  Education: Education Is Patient Currently Attending School?: No Last Grade Completed: 11 Did You Product manager?: No Did You Have An Individualized Education Program (IIEP): No Did You Have Any Difficulty At [employer]?: No Patient's Education Has Been Impacted by Current Illness: No   CCA Family/Childhood History Family and Relationship History: Family history Marital status: Single Does patient have children?: No  Childhood History:  Childhood History By whom was/is the patient raised?: Mother Did patient suffer any verbal/emotional/physical/sexual abuse as a child?: No Did patient suffer from severe childhood neglect?: No Has patient ever been sexually abused/assaulted/raped as an adolescent or adult?: No Was the patient ever a victim of a crime or a disaster?: No Witnessed domestic violence?: No Has patient been affected by domestic violence as an adult?: No       CCA Substance Use Alcohol/Drug Use: Alcohol / Drug Use Pain Medications: See MAR Prescriptions: See MAR Over the Counter: See MAR History of alcohol / drug use?: Yes Longest period of sobriety  (when/how long): Two weeks. Negative Consequences of Use: Financial, Legal, Personal relationships, Work / School Withdrawal Symptoms: Nausea / Vomiting, Sweats, Tingling Substance #1 Name of Substance 1: Xanax. 1 - Age of First Use: 18. 1 - Amount (size/oz): Pt reports, taking 2 mg, yesterday. 1 - Frequency: Everyday. 1 - Duration: Ongoing. 1 - Last Use / Amount: Inetta Fermo (10/06/2022). 1 - Method of Aquiring: Purchase. 1- Route of Use: Orally. Substance #2 Name of Substance 2: Fentanyl. 2 - Age of First Use: 25. 2 - Amount (size/oz): Pt reports, taking .2-.3 mg yesterday. 2 - Frequency: Everyday. 2 - Duration: Ongoing. 2 - Last Use / Amount: Yesterday (10/06/2022).  2 - Method of Aquiring: Purchase. 2 - Route of Substance Use: IV. Substance #3 Name of Substance 3: Marijuana. 3 - Age of First Use: Unsure 3 - Amount (size/oz): Pt reports, smoking one to two blunts, yesterday. 3 - Frequency: Everyday. 3 - Duration: Ongoing. 3 - Last Use / Amount: Yesterday (10/06/2022). 3 - Method of Aquiring: Purchase. 3 - Route of Substance Use: Smoke.    ASAM's:  Six Dimensions of Multidimensional Assessment  Dimension 1:  Acute Intoxication and/or Withdrawal Potential:   Dimension 1:  Description of individual's past and current experiences of substance use and withdrawal: Pt reports, he was vomiting fluids, sweating, tingling.  Dimension 2:  Biomedical Conditions and Complications:   Dimension 2:  Description of patient's biomedical conditions and  complications: None.  Dimension 3:  Emotional, Behavioral, or Cognitive Conditions and Complications:  Dimension 3:  Description of emotional, behavioral, or cognitive conditions and complications: Pt has symptoms of depression, anxiety and paranoia. Pt has a history of substance use.  Dimension 4:  Readiness to Change:  Dimension 4:  Description of Readiness to Change criteria: Pt is agreeable to receive help.  Dimension 5:  Relapse, Continued  use, or Continued Problem Potential:  Dimension 5:  Relapse, continued use, or continued problem potential critiera description: Pt has ongoing use since he was 27 years old. Pt reports, his longest period of sobriety was two weeks.  Dimension 6:  Recovery/Living Environment:  Dimension 6:  Recovery/Iiving environment criteria description: Pt reports, living with family.  ASAM Severity Score: ASAM's Severity Rating Score: 6  ASAM Recommended Level of Treatment: ASAM Recommended Level of Treatment: Level I Outpatient Treatment   Substance use Disorder (SUD) Substance Use Disorder (SUD)  Checklist Symptoms of Substance Use: Continued use despite having a persistent/recurrent physical/psychological problem caused/exacerbated by use  Recommendations for Services/Supports/Treatments: Recommendations for Services/Supports/Treatments Recommendations For Services/Supports/Treatments: Other (Comment) (Pt to be admitted to [facility] for Continuous Assessment.)  Discharge Disposition: Discharge Disposition Medical Exam completed: Yes  DSM5 Diagnoses: There are no problems to display for this patient.    Referrals to Alternative Service(s): Referred to Alternative Service(s):   Place:   Date:   Time:    Referred to Alternative Service(s):   Place:   Date:   Time:    Referred to Alternative Service(s):   Place:   Date:   Time:    Referred to Alternative Service(s):   Place:   Date:   Time:     Redmond Pulling, LCMHCComprehensive Clinical Assessment (CCA) Screening, Triage and Referral Note  10/08/2022 Brett Vazquez [id number]  Chief Complaint:  Chief Complaint  Patient presents with   Evaluation   Visit Diagnosis:   Patient Reported Information How did you hear about [place]? Legal System  What Is the Reason for Your Visit/Call Today? [facility] via [facility]. Pt states that he just wasn't feeling like himself today. Per [employer], pt called teling them that people were outside  his house. Per [employer], pt called them at least 5 times back to back. Per [employer], mom was at the home when he was calling about people being outside. Per [employer], mom stated that the pt had been tweaking out on drugs all day. Pt denies SI, HI, AVH and alcohol at this current times. Pt endorses using marijuana (2 grams) and xanax (2 footballs) on yesterday.  How Long Has This Been Causing You Problems? <Week  What Do You Feel Would Help You the Most Today? Social Support  Have You Recently Had Any Thoughts About Hurting Yourself? No  Are You Planning to Commit Suicide/Harm Yourself At This time? No   Have you Recently Had Thoughts About Hurting Someone Brett Vazquez? No  Are You Planning to Harm Someone at This Time? No  Explanation: Pt denies, HI.   Have You Used Any Alcohol or Drugs in the Past 24 Hours? Yes  How Long Ago Did You Use Drugs or Alcohol? Yesterday (10/06/2022) What Did You Use and How Much? smoked marijuana (2 grams) and xanax (2 footballs)   Do You Currently Have a Therapist/Psychiatrist? No  Name of Therapist/Psychiatrist: Pt denies, being linked to outpatient treatment.   Have You Been Recently Discharged From Any Office Practice or Programs? No  Explanation of Discharge From Practice/Program: None.    CCA Screening Triage Referral Assessment Type of Contact: Face-to-Face  Telemedicine Service Delivery:   Is this Initial or Reassessment?   Date Telepsych consult ordered in [facility]:    Time Telepsych consult ordered in [facility]:    Location of Assessment: [facility] Assessment [facility]  Provider Location: [facility] Assessment Services    Collateral Involvement: None.   Does Patient Have a Automotive engineer Guardian? No. Name and Contact of Legal Guardian: Pt is his own guardian.  If Minor and Not Living with Parent(s), Who has Custody? Pt is an adult.  Is CPS involved or ever been involved? Never  Is APS involved or ever been involved? Never   Patient Determined To Be At  Risk for Harm To Self or Others Based on Review of Patient Reported Information or Presenting Complaint? No  Method: No Plan  Availability of Means: No access or NA  Intent: Vague intent or NA  Notification Required: No need or identified person  Additional Information for Danger to Others Potential: -- (Pt denies, HI.)  Additional Comments for Danger to Others Potential: Pt denies, HI.  Are There Guns or Other Weapons in Your Home? No  Types of Guns/Weapons: Pt denies, access to weapons.  Are These Weapons Safely Secured?                            -- (Pt is his own guardian.)  Who Could Verify You Are Able To Have These Secured: Pt is his own guardian.  Do You Have any Outstanding Charges, Pending Court Dates, Parole/Probation? Pt denies, legal involvement.  Contacted To Inform of Risk of Harm To Self or Others: Other: Comment (None.)   Does Patient Present under Involuntary Commitment? No    [place] of Residence: [place]   Patient Currently Receiving the Following Services: Not Receiving Services   Determination of Need: Urgent (48 hours)   Options For Referral: Outpatient Therapy; [facility] Urgent Care; Facility-Based Crisis   Discharge Disposition:  Discharge Disposition Medical Exam completed: Yes  Redmond Pulling, [facility]

## 2022-10-07 NOTE — ED Provider Notes (Signed)
[facility] Continuous Assessment Admission H&P  Date: 10/07/22 Patient Name: Brett Vazquez MRN: [id number] Chief Complaint: opioid abuse, withdrawal  Diagnoses:  Final diagnoses:  Opioid abuse (HCC)    HPI:  patient presented to [facility] as a walk in voluntarily accompanied by [employer] with complaints of paranoia, opioid abuse, and possible withdrawal symptoms.   Brett Vazquez, 27 y.o., male patient seen face to face by this provider, consulted with Dr. Lucianne Muss; and chart reviewed on 10/07/22.  On evaluation Brett Vazquez reports no previous formal psychiatric history. He does admit to benzodiazapine and opioid abuse daily for around 3 years. Pt states he has used Xanax consistently, and did have a withdrawal seizure around 3 years ago. Pt denies any withdrawal seizures since then, but does report taking xanax daily to prevent seizure. He also mentions injecting Fentanyl for around 1 year now multiple times per week. He denies daily alcohol use, reports occasional social drinking. He endorses occasional THC use as well. Pt states his last Fentanyl use was 2 days ago. His last xanax use was yesterday. He believes he is using around 2 mg of Xanax daily. He is unsure of amount of Fentanyl he is using. Pt states its been around 24 hours since being "clean" and he is experiencing symptoms such as chills, diaphoresis, excessive yawning, and "not feeling like himself." Pt states today he experienced paranoia, and felt like he saw people outside of his house watching him. He lives with his mother and brother. His brother was home who kept telling him no one was out there and he was "hallucinating". Pt stated he called the police who also ensured him no one was there, and they encouraged him to get evaluated at [facility].   Patient denies SI. Denies any previous suicide attempts. He denies HI. Denies AVH. Pt stated his possible hallucinations/paranoia has never happened to him before so he started to  worry about his withdrawals/detox. He denies any current hallucinations or paranoia. He is wanting to become sober and get substance abuse treatment. He is willing to voluntarily stay for observation and possible admission to [facility] in the morning. Will initate CIWA/COWS protocol with Ativan taper.   During evaluation Brett Vazquez is sitting in no acute distress.  He is alert, oriented x 4, calm, cooperative and attentive.  His mood is anxious with congruent affect.  He has normal speech, and behavior.  Objectively there is no evidence of psychosis/mania or delusional thinking.  Patient is able to converse coherently, goal directed thoughts, no distractibility, or pre-occupation.  He also denies suicidal/self-harm/homicidal ideation. Reported paranoia earlier in the day, but denies currently.   Patient answered question appropriately.  Will recommend patient for overnight observation with Ativan taper, with possible admission to [facility] in the morning if appropriate candidate.   Total Time spent with patient: 45 minutes  Musculoskeletal  Strength & Muscle Tone: within normal limits Gait & Station: normal Patient leans: N/A  Psychiatric Specialty Exam  Presentation General Appearance:  Appropriate for Environment  Eye Contact: Good  Speech: Clear and Coherent  Speech Volume: Normal  Handedness:No data recorded  Mood and Affect  Mood: Anxious  Affect: Congruent   Thought Process  Thought Processes: Coherent  Descriptions of Associations:Intact  Orientation:Full (Time, Place and Person)  Thought Content:WDL    Hallucinations:Hallucinations: None  Ideas of Reference:Paranoia  Suicidal Thoughts:Suicidal Thoughts: No  Homicidal Thoughts:Homicidal Thoughts: No   Sensorium  Memory: Immediate Fair; Recent Fair  Judgment: Fair  Insight: Fair   Chartered certified accountant: Fair  Attention Span: Fair  Recall: [employer] of  Knowledge: Fair  Language: Fair   Psychomotor Activity  Psychomotor Activity: Psychomotor Activity: Restlessness   Assets  Assets: Desire for Improvement; Housing; Leisure Time; Physical Health   Sleep  Sleep: Sleep: Fair   No data recorded  Physical Exam Neurological:     Mental Status: He is alert and oriented to person, place, and time.  Psychiatric:        Attention and Perception: Attention normal.        Mood and Affect: Mood is anxious.        Speech: Speech normal.        Behavior: Behavior is cooperative.        Thought Content: Thought content is paranoid.    Review of Systems  Constitutional:  Positive for chills.  Gastrointestinal:  Positive for nausea.  Psychiatric/Behavioral:  Positive for substance abuse. The patient is nervous/anxious.        Feeling paranoid today, feels like he might of had visual hallucinations earlier in the day  All other systems reviewed and are negative.   There were no vitals taken for this visit. There is no height or weight on file to calculate BMI.  Past Psychiatric History: denies   Is the patient at risk to self? No  Has the patient been a risk to self in the past 6 months? No .    Has the patient been a risk to self within the distant past? No   Is the patient a risk to others? No   Has the patient been a risk to others in the past 6 months? No   Has the patient been a risk to others within the distant past? No   Past Medical History: previous withdrawal seizure from Xanax around 3 years ago  Family History: "unsure"  Social History: currently living at home with mother, unstable job  Last Labs:  No visits with results within 6 Month(s) from this visit.  Latest known visit with results is:  Admission on 05/04/2018, Discharged on 05/05/2018  Component Date Value Ref Range Status   Lipase 05/04/2018 28  11 - 51 U/L Final   Performed at [facility], [address], [place], [place] [address]   Sodium  05/04/2018 141  135 - 145 mmol/L Final   Potassium 05/04/2018 3.6  3.5 - 5.1 mmol/L Final   Chloride 05/04/2018 102  98 - 111 mmol/L Final   CO2 05/04/2018 27  22 - 32 mmol/L Final   Glucose, Bld 05/04/2018 78  70 - 99 mg/dL Final   BUN 65/78/4696 7  6 - 20 mg/dL Final   Creatinine, Ser 05/04/2018 0.98  0.61 - 1.24 mg/dL Final   Calcium 29/52/8413 9.2  8.9 - 10.3 mg/dL Final   Total Protein 24/40/1027 6.6  6.5 - 8.1 g/dL Final   Albumin 25/36/6440 4.1  3.5 - 5.0 g/dL Final   AST 34/74/2595 59 (H)  15 - 41 U/L Final   ALT 05/04/2018 55 (H)  0 - 44 U/L Final   Alkaline Phosphatase 05/04/2018 52  38 - 126 U/L Final   Total Bilirubin 05/04/2018 0.5  0.3 - 1.2 mg/dL Final   GFR calc non Af Amer 05/04/2018 >60  >60 mL/min Final   GFR calc Af Amer 05/04/2018 >60  >60 mL/min Final   Anion gap 05/04/2018 12  5 - 15 Final   Performed  at [facility], [address] [place], [place] [address]   WBC 05/04/2018 13.7 (H)  4.0 - 10.5 K/uL Final   RBC 05/04/2018 4.53  4.22 - 5.81 MIL/uL Final   Hemoglobin 05/04/2018 14.2  13.0 - 17.0 g/dL Final   HCT 30/86/5784 43.3  39.0 - 52.0 % Final   MCV 05/04/2018 95.6  80.0 - 100.0 fL Final   MCH 05/04/2018 31.3  26.0 - 34.0 pg Final   MCHC 05/04/2018 32.8  30.0 - 36.0 g/dL Final   RDW 69/62/9528 12.2  11.5 - 15.5 % Final   Platelets 05/04/2018 260  150 - 400 K/uL Final   nRBC 05/04/2018 0.0  0.0 - 0.2 % Final   Performed at [facility], [address], [place], [place] [address]   Color, Urine 05/04/2018 YELLOW  YELLOW Final   APPearance 05/04/2018 CLEAR  CLEAR Final   Specific Gravity, Urine 05/04/2018 1.015  1.005 - 1.030 Final   pH 05/04/2018 6.0  5.0 - 8.0 Final   Glucose, UA 05/04/2018 >=500 (A)  NEGATIVE mg/dL Final   Hgb urine dipstick 05/04/2018 NEGATIVE  NEGATIVE Final   Bilirubin Urine 05/04/2018 NEGATIVE  NEGATIVE Final   Ketones, ur 05/04/2018 NEGATIVE  NEGATIVE mg/dL Final   Protein, ur 40/11/2723 NEGATIVE  NEGATIVE mg/dL Final    Nitrite 36/64/4034 NEGATIVE  NEGATIVE Final   Leukocytes,Ua 05/04/2018 NEGATIVE  NEGATIVE Final   RBC / HPF 05/04/2018 0-5  0 - 5 RBC/hpf Final   WBC, UA 05/04/2018 0-5  0 - 5 WBC/hpf Final   Bacteria, UA 05/04/2018 RARE (A)  NONE SEEN Final   Squamous Epithelial / HPF 05/04/2018 0-5  0 - 5 Final   Mucus 05/04/2018 PRESENT   Final   Hyaline Casts, UA 05/04/2018 PRESENT   Final   Amorphous Crystal 05/04/2018 PRESENT   Final   Performed at [facility], [address], [place], [place] [address]    Allergies: Patient has no known allergies.  Medications:  Facility Ordered Medications  Medication   multivitamin with minerals tablet 1 tablet   LORazepam (ATIVAN) tablet 1 mg   hydrOXYzine (ATARAX) tablet 25 mg   loperamide (IMODIUM) capsule 2-4 mg   ondansetron (ZOFRAN-ODT) disintegrating tablet 4 mg   dicyclomine (BENTYL) tablet 20 mg   methocarbamol (ROBAXIN) tablet 500 mg   naproxen (NAPROSYN) tablet 500 mg   acetaminophen (TYLENOL) tablet 650 mg   alum & mag hydroxide-simeth (MAALOX/MYLANTA) 200-200-20 MG/5ML suspension 30 mL   magnesium hydroxide (MILK OF MAGNESIA) suspension 30 mL   traZODone (DESYREL) tablet 50 mg   LORazepam (ATIVAN) tablet 1 mg   PTA Medications  Medication Sig   oxyCODONE-acetaminophen (PERCOCET/ROXICET) 5-325 MG tablet Take 1 tablet by mouth every 6 (six) hours as needed for severe pain.   ondansetron (ZOFRAN ODT) 4 MG disintegrating tablet Take 1 tablet (4 mg total) by mouth every 8 (eight) hours as needed for nausea or vomiting.      Medical Decision Making   Pt being admitted to overnight observation with COWS/CIWA in place. Will initiate Ativan taper due to xanax withdrawal and hx of withdrawal seizure. Will request patient is reviewed for [facility] tomorrow morning if appropriate.     Recommendations  Overnight observation with evaluation for possible admission to [facility]  Eligha Bridegroom, NP 10/07/22  6:35 PM

## 2022-10-07 NOTE — Progress Notes (Signed)
   10/07/22 1728  [facility] Triage Screening (Walk-ins at [facility] only)  How Did You Hear About [place]? Legal System  What Is the Reason for Your Visit/Call Today? Pt arrived to [facility] voluntarily via [employer]. Pt states that he just wasn't feeling like himself today. Per [employer], pt called teling them that people were outside his house. Per [employer], pt called them at least 5 times back to back. Per [employer], mom was at the home when he was calling about people being outside. Per [employer], mom stated that the pt had been tweaking out on drugs all day. Pt denies SI, HI, AVH and alcohol at this current times. Pt endorses using marijuana (2 grams) and xanax (2 footballs) on yesterday.  How Long Has This Been Causing You Problems? <Week  Have You Recently Had Any Thoughts About Hurting Yourself? No  Are You Planning to Commit Suicide/Harm Yourself At This time? No  Have you Recently Had Thoughts About Hurting Someone [name]? No  Are You Planning To Harm Someone At This Time? No  Are you currently experiencing any auditory, visual or other hallucinations? No  Have You Used Any Alcohol or Drugs in the Past 24 Hours? Yes  How long ago did you use Drugs or Alcohol? yesterday  What Did You Use and How Much? smoked marijuana (2 grams) and xanax (2 footballs)  Do you have any current medical co-morbidities that require immediate attention? No  Clinician description of patient physical appearance/behavior: calm, cooperative  What Do You Feel Would Help You the Most Today? Social Support  If access to [facility] was not available, would you have sought care in the Emergency Department? No  Determination of Need Routine (7 days)  Options For Referral Outpatient Therapy

## 2022-10-07 NOTE — BH Assessment (Incomplete)
Comprehensive Clinical Assessment (CCA) Note  10/07/2022 HERBET WIELENGA [id number]  Disposition: Eligha Bridegroom, NP recommends pt to be admitted to [facility] for Continuous Assessment.   The patient demonstrates the following risk factors for suicide: Chronic risk factors for suicide include: {Chronic Risk Factors for KGURKYH:06237628}. Acute risk factors for suicide include: {Acute Risk Factors for BTDVVOH:60737106}. Protective factors for this patient include: {Protective Factors for Suicide YIRS:85462703}. Considering these factors, the overall suicide risk at this point appears to be {Desc; low/moderate/high:110033}. Patient {ACTION; IS/IS JKK:93818299} appropriate for outpatient follow up.  Roselind Messier. Laramore is a 27 year old male who presents voluntary and unaccompanied to [facility]. Clinician asked the pt, "what brought you to the hospital?" Pt reports, he's not been feeling like himself. Pt reports, he's paranoid, feels that people are watching him. Pt reports, he just started having Pt denies, SI, HI, self-injurious behaviors and access to weapons.   Pt reports, taking 2 mg of Xanax, yesterday (10/06/2022). Pt reports, taking .2-.3 mg IV of Fentanyl, yesterday. Pt reports, smoking one or two blunts, yesterday. Pt's UDS is positive for Oxazepam, Oxycodone and Marijuana. Pt denies, being linked to OPT resources (medication management and/or counseling.)   Pt presents disheveled, with normal speech. Pt's mood was depressed, anxious. Pt's affect was congruent. Pt's insight and judgement was fair. Pt's  Chief Complaint:  Chief Complaint  Patient presents with  . Evaluation   Visit Diagnosis:     CCA Screening, Triage and Referral (STR)  Patient Reported Information How did you hear about [place]? Legal System  What Is the Reason for Your Visit/Call Today? Pt arrived to [facility] voluntarily via [employer]. Pt states that he just wasn't feeling like himself today. Per [employer], pt called teling them that  people were outside his house. Per [employer], pt called them at least 5 times back to back. Per [employer], mom was at the home when he was calling about people being outside. Per [employer], mom stated that the pt had been tweaking out on drugs all day. Pt denies SI, HI, AVH and alcohol at this current times. Pt endorses using marijuana (2 grams) and xanax (2 footballs) on yesterday.  How Long Has This Been Causing You Problems? <Week  What Do You Feel Would Help You the Most Today? Social Support   Have You Recently Had Any Thoughts About Hurting Yourself? No  Are You Planning to Commit Suicide/Harm Yourself At This time? No   Flowsheet Row ED from 10/07/2022 in [facility]  C-SSRS RISK CATEGORY No Risk       Have you Recently Had Thoughts About Hurting Someone [name]? No  Are You Planning to Harm Someone at This Time? No  Explanation: Pt denies, HI.   Have You Used Any Alcohol or Drugs in the Past 24 Hours? Yes  What Did You Use and How Much? smoked marijuana (2 grams) and xanax (2 footballs)   Do You Currently Have a Therapist/Psychiatrist? No  Name of Therapist/Psychiatrist: Name of Therapist/Psychiatrist: Pt denies, being linked to outpatient treatment.   Have You Been Recently Discharged From Any Office Practice or Programs? No  Explanation of Discharge From Practice/Program: None.     CCA Screening Triage Referral Assessment Type of Contact: Face-to-Face  Telemedicine Service Delivery:   Is this Initial or Reassessment?   Date Telepsych consult ordered in [facility]:    Time Telepsych consult ordered in [facility]:    Location of Assessment: [facility] Assessment Services  Provider Location: [facility] Assessment  Services   Collateral Involvement: None.   Does Patient Have a Automotive engineer Guardian? No  Legal Guardian Contact Information: Pt is his own guardian.  Copy of Legal Guardianship Form: -- (Pt is his own guardian.)  Legal Guardian Notified of  Arrival: -- (Pt is his own guardian.)  Legal Guardian Notified of Pending Discharge: -- (Pt is his own guardian.)  If Minor and Not Living with Parent(s), Who has Custody? Pt is an adult.  Is CPS involved or ever been involved? Never  Is APS involved or ever been involved? Never   Patient Determined To Be At Risk for Harm To Self or Others Based on Review of Patient Reported Information or Presenting Complaint? No  Method: No Plan  Availability of Means: No access or NA  Intent: Vague intent or NA  Notification Required: No need or identified person  Additional Information for Danger to Others Potential: -- (Pt denies, HI.)  Additional Comments for Danger to Others Potential: Pt denies, HI.  Are There Guns or Other Weapons in Your Home? No  Types of Guns/Weapons: Pt denies, access to weapons.  Are These Weapons Safely Secured?                            -- (Pt is his own guardian.)  Who Could Verify You Are Able To Have These Secured: Pt is his own guardian.  Do You Have any Outstanding Charges, Pending Court Dates, Parole/Probation? Pt denies, legal involvement.  Contacted To Inform of Risk of Harm To Self or Others: Other: Comment (None.)    Does Patient Present under Involuntary Commitment? No    [place] of Residence: [place]   Patient Currently Receiving the Following Services: Not Receiving Services   Determination of Need: Urgent (48 hours)   Options For Referral: Outpatient Therapy; [facility]; Facility-Based Crisis     CCA Biopsychosocial Patient Reported Schizophrenia/Schizoaffective Diagnosis in Past: No   Strengths: Pt has family, friend supports.   Mental Health Symptoms Depression:   Fatigue; Sleep (too much or little); Worthlessness; Hopelessness; Irritability; Increase/decrease in appetite; Difficulty Concentrating (Isolation.)   Duration of Depressive symptoms:  Duration of Depressive Symptoms: Greater than two weeks   Mania:    None   Anxiety:    Difficulty concentrating; Irritability; Fatigue; Restlessness; Worrying   Psychosis:   -- (Paranoia.)   Duration of Psychotic symptoms:    Trauma:   None   Obsessions:   None   Compulsions:   None   Inattention:   None   Hyperactivity/Impulsivity:   Feeling of restlessness   Oppositional/Defiant Behaviors:   Angry   Emotional Irregularity:   None   Other Mood/Personality Symptoms:   Depression and anxiety symptoms.    Mental Status Exam Appearance and self-care  Stature:   Average   Weight:   Average weight   Clothing:   Disheveled   Grooming:   Neglected   Cosmetic use:   None   Posture/gait:   Normal   Motor activity:   Not Remarkable   Sensorium  Attention:   Normal   Concentration:   Normal   Orientation:   X5   Recall/memory:  No data recorded  Affect and Mood  Affect:  No data recorded  Mood:  No data recorded  Relating  Eye contact:   Normal   Facial expression:   Responsive   Attitude toward examiner:  No data recorded  Thought and Language  Speech flow:  Normal   Thought content:   Appropriate to Mood and Circumstances   Preoccupation:   None   Hallucinations:   -- (Paranoia.)   Organization:   Coherent   [employer] of Knowledge:   Fair   Intelligence:   Average   Abstraction:   Functional   Judgement:   Fair   Dance movement psychotherapist:   Realistic   Insight:   Fair   Decision Making:   Impulsive   Social Functioning  Social Maturity:   Impulsive; Isolates   Social Judgement:   "Street Smart"   Stress  Stressors:   Other (Comment) (Pt reports, his family's safety, (he feels his family is at risk of being harmed.) Pt reports, he feels like people are around his house.)   Coping Ability:   Exhausted   Skill Deficits:   Decision making; Self-care   Supports:   Family     Religion: Religion/Spirituality Are You A Religious Person?: No How Might  This Affect Treatment?: None.  Leisure/Recreation: Leisure / Recreation Do You Have Hobbies?: No  Exercise/Diet: Exercise/Diet Do You Exercise?: No Have You Gained or Lost A Significant Amount of Weight in the Past Six Months?: No Do You Follow a Special Diet?: No Do You Have Any Trouble Sleeping?: Yes Explanation of Sleeping Difficulties: Pt reports, he has not been sleep since yesterday.   CCA Employment/Education Employment/Work Situation: Employment / Work Situation Employment Situation: Unemployed Patient's Job has Been Impacted by Current Illness: No Has Patient ever Been in Equities trader?: No  Education: Education Is Patient Currently Attending School?: No Last Grade Completed: 11 Did You Product manager?: No Did You Have An Individualized Education Program (IIEP): No Did You Have Any Difficulty At [employer]?: No Patient's Education Has Been Impacted by Current Illness: No   CCA Family/Childhood History Family and Relationship History: Family history Marital status: Single Does patient have children?: No  Childhood History:  Childhood History By whom was/is the patient raised?: Mother Did patient suffer any verbal/emotional/physical/sexual abuse as a child?: No Did patient suffer from severe childhood neglect?: No Has patient ever been sexually abused/assaulted/raped as an adolescent or adult?: No Was the patient ever a victim of a crime or a disaster?: No Witnessed domestic violence?: No Has patient been affected by domestic violence as an adult?: No       CCA Substance Use Alcohol/Drug Use: Alcohol / Drug Use Pain Medications: See MAR Prescriptions: See MAR Over the Counter: See MAR History of alcohol / drug use?: Yes Longest period of sobriety (when/how long): Two weeks. Negative Consequences of Use: Financial, Legal, Personal relationships, Work / School Withdrawal Symptoms: Nausea / Vomiting, Sweats, Tingling Substance #1 Name of Substance 1:  Xanax. 1 - Age of First Use: 18. 1 - Amount (size/oz): Pt reports, taking 2 mg, yesterday. 1 - Frequency: Everyday. 1 - Duration: Ongoing. 1 - Last Use / Amount: Inetta Fermo (10/06/2022). 1 - Method of Aquiring: Purchase. 1- Route of Use: Orally. Substance #2 Name of Substance 2: Fentanyl. 2 - Age of First Use: 25. 2 - Amount (size/oz): Pt reports, taking .2-.3 mg yesterday. 2 - Frequency: Everyday. 2 - Duration: Ongoing. 2 - Last Use / Amount: Yesterday (10/06/2022). 2 - Method of Aquiring: Purchase. 2 - Route of Substance Use: IV. Substance #3 Name of Substance 3: Marijuana. 3 - Age of First Use: Unsure 3 - Amount (size/oz): Pt reports, smoking one to two blunts, yesterday. 3 - Frequency: Everyday. 3 - Duration: Ongoing.  3 - Last Use / Amount: Yesterday (10/06/2022). 3 - Method of Aquiring: Purchase. 3 - Route of Substance Use: Smoke.                   ASAM's:  Six Dimensions of Multidimensional Assessment  Dimension 1:  Acute Intoxication and/or Withdrawal Potential:   Dimension 1:  Description of individual's past and current experiences of substance use and withdrawal: Pt reports, he was vomiting fluids, sweating, tingling.  Dimension 2:  Biomedical Conditions and Complications:   Dimension 2:  Description of patient's biomedical conditions and  complications: None.  Dimension 3:  Emotional, Behavioral, or Cognitive Conditions and Complications:  Dimension 3:  Description of emotional, behavioral, or cognitive conditions and complications: Pt has symptoms of depression, anxiety and paranoia. Pt has a history of substance use.  Dimension 4:  Readiness to Change:  Dimension 4:  Description of Readiness to Change criteria: Pt is agreeable to receive help.  Dimension 5:  Relapse, Continued use, or Continued Problem Potential:  Dimension 5:  Relapse, continued use, or continued problem potential critiera description: Pt has ongoing use since he was 27 years old. Pt reports,  his longest period of sobriety was two weeks.  Dimension 6:  Recovery/Living Environment:  Dimension 6:  Recovery/Iiving environment criteria description: Pt reports, living with family.  ASAM Severity Score: ASAM's Severity Rating Score: 5  ASAM Recommended Level of Treatment: ASAM Recommended Level of Treatment: Level I Outpatient Treatment   Substance use Disorder (SUD) Substance Use Disorder (SUD)  Checklist Symptoms of Substance Use: Continued use despite having a persistent/recurrent physical/psychological problem caused/exacerbated by use  Recommendations for Services/Supports/Treatments: Recommendations for Services/Supports/Treatments Recommendations For Services/Supports/Treatments: Other (Comment) (Pt to be admitted to [facility] for Continuous Assessment.)  Discharge Disposition: Discharge Disposition Medical Exam completed: Yes  DSM5 Diagnoses: There are no problems to display for this patient.    Referrals to Alternative Service(s): Referred to Alternative Service(s):   Place:   Date:   Time:    Referred to Alternative Service(s):   Place:   Date:   Time:    Referred to Alternative Service(s):   Place:   Date:   Time:    Referred to Alternative Service(s):   Place:   Date:   Time:     Redmond Pulling, LCMHCComprehensive Clinical Assessment (CCA) Screening, Triage and Referral Note  10/07/2022 MARILYN OSEJO [id number]  Chief Complaint:  Chief Complaint  Patient presents with  . Evaluation   Visit Diagnosis: ***  Patient Reported Information How did you hear about [place]? Legal System  What Is the Reason for Your Visit/Call Today? Pt arrived to [facility] voluntarily via [facility]. Pt states that he just wasn't feeling like himself today. Per [employer], pt called teling them that people were outside his house. Per [employer], pt called them at least 5 times back to back. Per [employer], mom was at the home when he was calling about people being outside. Per [employer], mom stated that the pt had been  tweaking out on drugs all day. Pt denies SI, HI, AVH and alcohol at this current times. Pt endorses using marijuana (2 grams) and xanax (2 footballs) on yesterday.  How Long Has This Been Causing You Problems? <Week  What Do You Feel Would Help You the Most Today? Social Support   Have You Recently Had Any Thoughts About Hurting Yourself? No  Are You Planning to Commit Suicide/Harm Yourself At This time? No   Have you Recently Had Thoughts About  Hurting Someone [name]? No  Are You Planning to Harm Someone at This Time? No  Explanation: Pt denies, HI.   Have You Used Any Alcohol or Drugs in the Past 24 Hours? Yes  How Long Ago Did You Use Drugs or Alcohol? No data recorded What Did You Use and How Much? smoked marijuana (2 grams) and xanax (2 footballs)   Do You Currently Have a Therapist/Psychiatrist? No  Name of Therapist/Psychiatrist: Pt denies, being linked to outpatient treatment.   Have You Been Recently Discharged From Any Office Practice or Programs? No  Explanation of Discharge From Practice/Program: None.    CCA Screening Triage Referral Assessment Type of Contact: Face-to-Face  Telemedicine Service Delivery:   Is this Initial or Reassessment?   Date Telepsych consult ordered in [facility]:    Time Telepsych consult ordered in [facility]:    Location of Assessment: [facility] Assessment Services  Provider Location: [facility] Assessment Services    Collateral Involvement: None.   Does Patient Have a Automotive engineer Guardian? No data recorded Name and Contact of Legal Guardian: No data recorded If Minor and Not Living with Parent(s), Who has Custody? Pt is an adult.  Is CPS involved or ever been involved? Never  Is APS involved or ever been involved? Never   Patient Determined To Be At Risk for Harm To Self or Others Based on Review of Patient Reported Information or Presenting Complaint? No  Method: No Plan  Availability of Means: No access or NA  Intent: Vague  intent or NA  Notification Required: No need or identified person  Additional Information for Danger to Others Potential: -- (Pt denies, HI.)  Additional Comments for Danger to Others Potential: Pt denies, HI.  Are There Guns or Other Weapons in Your Home? No  Types of Guns/Weapons: Pt denies, access to weapons.  Are These Weapons Safely Secured?                            -- (Pt is his own guardian.)  Who Could Verify You Are Able To Have These Secured: Pt is his own guardian.  Do You Have any Outstanding Charges, Pending Court Dates, Parole/Probation? Pt denies, legal involvement.  Contacted To Inform of Risk of Harm To Self or Others: Other: Comment (None.)   Does Patient Present under Involuntary Commitment? No    [place] of Residence: [place]   Patient Currently Receiving the Following Services: Not Receiving Services   Determination of Need: Urgent (48 hours)   Options For Referral: Outpatient Therapy; [facility]; Facility-Based Crisis   Discharge Disposition:  Discharge Disposition Medical Exam completed: Yes  Redmond Pulling, [facility]

## 2022-10-07 NOTE — ED Notes (Signed)
Pt A&O x 4, presents with paranoia and hallucinations, that people are watching him.  Denies SI, HI .  Pt admits to substance abuse.  Fentanyl use.  Monitoring for safety.

## 2022-10-08 ENCOUNTER — Other Ambulatory Visit (HOSPITAL_COMMUNITY)
Admission: EM | Admit: 2022-10-08 | Discharge: 2022-10-10 | Disposition: A | Discharge status: Home / Self Care | Payer: 59 | Attending: Psychiatry | Admitting: Psychiatry

## 2022-10-08 DIAGNOSIS — F1590 Other stimulant use, unspecified, uncomplicated: Secondary | ICD-10-CM

## 2022-10-08 DIAGNOSIS — F1110 Opioid abuse, uncomplicated: Secondary | ICD-10-CM | POA: Diagnosis not present

## 2022-10-08 DIAGNOSIS — F1290 Cannabis use, unspecified, uncomplicated: Secondary | ICD-10-CM

## 2022-10-08 DIAGNOSIS — F1190 Opioid use, unspecified, uncomplicated: Secondary | ICD-10-CM

## 2022-10-08 DIAGNOSIS — F17200 Nicotine dependence, unspecified, uncomplicated: Secondary | ICD-10-CM

## 2022-10-08 MED ORDER — SENNA 8.6 MG PO TABS: 1.0000 | ORAL_TABLET | Freq: Every evening | ORAL | Status: DC | PRN

## 2022-10-08 MED ORDER — BISMUTH SUBSALICYLATE 262 MG PO CHEW: 524.0000 mg | CHEWABLE_TABLET | ORAL | Status: DC | PRN

## 2022-10-08 MED ORDER — POTASSIUM CHLORIDE 20 MEQ PO PACK
40.0000 meq | PACK | Freq: Two times a day (BID) | ORAL | Status: AC
  Administered 2022-10-08 (×2): 40 meq via ORAL
  Filled 2022-10-08 (×2): qty 2

## 2022-10-08 MED ORDER — LORAZEPAM 1 MG PO TABS: 1.0000 mg | ORAL_TABLET | ORAL | Status: DC | PRN

## 2022-10-08 MED ORDER — HALOPERIDOL LACTATE 5 MG/ML IJ SOLN: 5.0000 mg | Freq: Four times a day (QID) | INTRAMUSCULAR | Status: DC | PRN

## 2022-10-08 MED ORDER — CHLORDIAZEPOXIDE HCL 25 MG PO CAPS
25.0000 mg | ORAL_CAPSULE | Freq: Four times a day (QID) | ORAL | Status: AC
  Administered 2022-10-08 – 2022-10-09 (×3): 25 mg via ORAL
  Filled 2022-10-08 (×3): qty 1

## 2022-10-08 MED ORDER — POTASSIUM CHLORIDE 20 MEQ PO PACK: 40.0000 meq | PACK | ORAL | Status: DC

## 2022-10-08 MED ORDER — CHLORDIAZEPOXIDE HCL 25 MG PO CAPS
25.0000 mg | ORAL_CAPSULE | Freq: Three times a day (TID) | ORAL | Status: DC
  Administered 2022-10-09 (×2): 25 mg via ORAL
  Filled 2022-10-08 (×2): qty 1

## 2022-10-08 MED ORDER — MELATONIN 3 MG PO TABS
3.0000 mg | ORAL_TABLET | Freq: Every evening | ORAL | Status: DC | PRN
  Administered 2022-10-08 – 2022-10-09 (×2): 3 mg via ORAL
  Filled 2022-10-08 (×2): qty 1

## 2022-10-08 MED ORDER — HYDROXYZINE HCL 25 MG PO TABS
25.0000 mg | ORAL_TABLET | Freq: Three times a day (TID) | ORAL | Status: DC | PRN
  Administered 2022-10-08: 25 mg via ORAL
  Filled 2022-10-08: qty 1

## 2022-10-08 MED ORDER — POLYETHYLENE GLYCOL 3350 17 G PO PACK: 17.0000 g | PACK | Freq: Every day | ORAL | Status: DC | PRN

## 2022-10-08 MED ORDER — NICOTINE 14 MG/24HR TD PT24: 14.0000 mg | MEDICATED_PATCH | Freq: Every day | TRANSDERMAL | Status: DC | PRN

## 2022-10-08 MED ORDER — ALUM & MAG HYDROXIDE-SIMETH 200-200-20 MG/5ML PO SUSP: 30.0000 mL | ORAL | Status: DC | PRN

## 2022-10-08 MED ORDER — NICOTINE POLACRILEX 2 MG MT GUM
2.0000 mg | CHEWING_GUM | OROMUCOSAL | Status: DC | PRN
  Administered 2022-10-09: 2 mg via ORAL
  Filled 2022-10-08: qty 1

## 2022-10-08 MED ORDER — LORAZEPAM 2 MG/ML IJ SOLN: 1.0000 mg | Freq: Four times a day (QID) | INTRAMUSCULAR | Status: DC | PRN

## 2022-10-08 MED ORDER — LORAZEPAM 1 MG PO TABS: 1.0000 mg | ORAL_TABLET | Freq: Four times a day (QID) | ORAL | Status: DC | PRN

## 2022-10-08 MED ORDER — DIPHENHYDRAMINE HCL 50 MG/ML IJ SOLN: 25.0000 mg | Freq: Four times a day (QID) | INTRAMUSCULAR | Status: DC | PRN

## 2022-10-08 MED ORDER — METHOCARBAMOL 500 MG PO TABS: 500.0000 mg | ORAL_TABLET | Freq: Three times a day (TID) | ORAL | Status: DC | PRN

## 2022-10-08 MED ORDER — CHLORDIAZEPOXIDE HCL 25 MG PO CAPS: 25.0000 mg | ORAL_CAPSULE | Freq: Once | ORAL | Status: DC

## 2022-10-08 MED ORDER — POTASSIUM CHLORIDE CRYS ER 20 MEQ PO TBCR
40.0000 meq | EXTENDED_RELEASE_TABLET | Freq: Once | ORAL | Status: AC
  Administered 2022-10-08: 40 meq via ORAL
  Filled 2022-10-08: qty 2

## 2022-10-08 MED ORDER — DICYCLOMINE HCL 20 MG PO TABS: 20.0000 mg | ORAL_TABLET | Freq: Four times a day (QID) | ORAL | Status: DC | PRN

## 2022-10-08 MED ORDER — CLONIDINE HCL 0.1 MG PO TABS: 0.1000 mg | ORAL_TABLET | ORAL | Status: DC | PRN

## 2022-10-08 MED ORDER — ONDANSETRON 4 MG PO TBDP: 8.0000 mg | ORAL_TABLET | Freq: Three times a day (TID) | ORAL | Status: DC | PRN

## 2022-10-08 MED ORDER — NICOTINE 7 MG/24HR TD PT24: 7.0000 mg | MEDICATED_PATCH | Freq: Every day | TRANSDERMAL | Status: DC | PRN

## 2022-10-08 MED ORDER — HALOPERIDOL 5 MG PO TABS: 5.0000 mg | ORAL_TABLET | Freq: Four times a day (QID) | ORAL | Status: DC | PRN

## 2022-10-08 MED ORDER — CHLORDIAZEPOXIDE HCL 25 MG PO CAPS: 25.0000 mg | ORAL_CAPSULE | Freq: Two times a day (BID) | ORAL | Status: DC

## 2022-10-08 MED ORDER — LORAZEPAM 2 MG/ML IJ SOLN: 1.0000 mg | INTRAMUSCULAR | Status: DC | PRN

## 2022-10-08 MED ORDER — LOPERAMIDE HCL 2 MG PO CAPS: 2.0000 mg | ORAL_CAPSULE | ORAL | Status: DC | PRN

## 2022-10-08 MED ORDER — NAPROXEN 500 MG PO TABS: 500.0000 mg | ORAL_TABLET | Freq: Two times a day (BID) | ORAL | Status: DC | PRN

## 2022-10-08 MED ORDER — DIPHENHYDRAMINE HCL 25 MG PO CAPS: 25.0000 mg | ORAL_CAPSULE | Freq: Four times a day (QID) | ORAL | Status: DC | PRN

## 2022-10-08 MED ORDER — THIAMINE MONONITRATE 100 MG PO TABS
100.0000 mg | ORAL_TABLET | Freq: Every day | ORAL | Status: DC
  Administered 2022-10-08 – 2022-10-09 (×2): 100 mg via ORAL
  Filled 2022-10-08 (×2): qty 1

## 2022-10-08 MED ORDER — POTASSIUM CHLORIDE 20 MEQ PO PACK: 40.0000 meq | PACK | Freq: Once | ORAL | Status: AC

## 2022-10-08 MED ORDER — ADULT MULTIVITAMIN W/MINERALS CH
1.0000 | ORAL_TABLET | Freq: Every day | ORAL | Status: DC
  Administered 2022-10-09: 1 via ORAL
  Filled 2022-10-08: qty 1

## 2022-10-08 MED ORDER — ACETAMINOPHEN 325 MG PO TABS: 650.0000 mg | ORAL_TABLET | Freq: Four times a day (QID) | ORAL | Status: DC | PRN

## 2022-10-08 NOTE — Group Note (Unsigned)
Group Topic: Healthy Self Image and Positive Change  Group Date: 10/08/2022 Start Time: 1145 End Time: 1200 Facilitators: Jenean Lindau, RN  Department: [facility]  Number of Participants: 3  Group Focus: affirmation Treatment Modality:  Behavior Modification Therapy Interventions utilized were exploration Purpose: express feelings   Name: Brett Vazquez Date of Birth: 04/30/1995  MR: [id number]    Level of Participation: {THERAPIES; PSYCH GROUP PARTICIPATION UXLKG:40102} Quality of Participation: {THERAPIES; PSYCH QUALITY OF PARTICIPATION:23992} Interactions with others: {THERAPIES; PSYCH INTERACTIONS:23993} Mood/Affect: {THERAPIES; PSYCH MOOD/AFFECT:23994} Triggers (if applicable): *** Cognition: {THERAPIES; PSYCH COGNITION:23995} Progress: {THERAPIES; PSYCH PROGRESS:23997} Response: *** Plan: {THERAPIES; PSYCH VOZD:66440}  Patients Problems:  Patient Active Problem List   Diagnosis Date Noted   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

## 2022-10-08 NOTE — ED Notes (Signed)
Patient observed/assessed in bed/chair resting quietly appearing in no distress and verbalizing no complaints at this time. Will continue to monitor.  

## 2022-10-08 NOTE — Group Note (Signed)
Group Topic: Relapse and Recovery  Group Date: 10/08/2022 Start Time: 2000 End Time: 2100 Facilitators: Rae Lips B  Department: [facility]  Number of Participants: 2  Group Focus: abuse issues, acceptance, activities of daily living skills, and communication Treatment Modality:  Leisure Development Interventions utilized were support Purpose: express feelings and relapse prevention strategies  Name: Brett Vazquez Date of Birth: 30-Dec-1995  MR: [id number]    Level of Participation: Pt did not attend group. Quality of Participation: NA Interactions with others: NA Mood/Affect: NA Triggers (if applicable): NA Cognition: NA Progress: Other Response: NA Plan: patient will be encouraged to go to groups.   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

## 2022-10-08 NOTE — ED Notes (Addendum)
Patient lying in bed no distress noted.

## 2022-10-08 NOTE — ED Notes (Signed)
Patient A&O X 4, ambulatory. Pt discharged in no acute distress. Pt denies SI/HI/AVS upon discharge. Patient verbalized understanding of discharge to [facility]. Pt escorted to unit by staff. Safety maintained

## 2022-10-08 NOTE — ED Notes (Signed)
Pt sleeping at presents, no distress noted.  Monitoring for safety.

## 2022-10-08 NOTE — ED Notes (Signed)
Pt asleep at this time. NAD. Respirations are even and unlabored. Will continue to monitor for safety.

## 2022-10-08 NOTE — ED Provider Notes (Signed)
FBC/OBS ASAP Discharge Summary  Date and Time: 10/08/2022 1:19 PM  Name: Brett Vazquez  MRN:  [id number]   Discharge Diagnoses:  Final diagnoses:  Opioid abuse (HCC)  Cannabis use disorder  Moderate benzodiazepine use disorder (HCC)   Subjective:  Patient seen in bed this morning and in the assessment room.  Patient is alert, however patient appears to be very guarded.  Patient reports that he is "doing okay."  He reports that he is currently going through withdrawal, feeling cold sweats and decreased sleep.  He reports prior use of fentanyl.  He denies any chest pain or palpitations.  He reports that when he was at home "stuff just do not feel right."  He denies any He denies any current auditory or visual hallucinations.  He denies any thought insertion, thought withdrawal or thought blocking.  He denies any current SI/HI/AVH.  Patient reports that he wants to go to rehab.   Patient's mom was called for collateral.  Mom reported that a few weeks ago patient started to feel like someone was after him.  Yesterday he said that someone was in the living room closet when there was not anyone.  She reports that patient went to [facility] in his early teens, but she does not know of any diagnoses.  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  She denies any family psychiatric history.  She denies that patient had a prior periods where he appeared to be responding to internal or external stimuli.  She reports current drug use.  She denies any history of hospitalizations related to withdrawals.  Stay Summary:  Patient is a 27 year old male with benzodiazepine use, opioid use, fentanyl and marijuana use who presented to the behavioral health urgent care with paranoia and hallucinations in the setting of his substance use and withdrawal.  He was admitted overnight observation with CIWA/COWS protocol and given Ativan.  On morning  reassessment, patient continued to report withdrawal symptoms.  Patient did not appear to be responding to internal or external stimuli.  He was noted to be hypokalemic and his potassium was repleted. He was recommended to be admitted to the [facility].  Total Time spent with patient: 30 minutes  Past Psychiatric History: fentanyl use, cannabis use, opioid use, benzodiazepine use  Past Medical History: none Family Psychiatric History: no family history of schizophrenia  Social History: lives with mom. Unemployed. Tobacco Cessation:  A prescription for an FDA-approved tobacco cessation medication was offered at discharge and the patient refused  Current Medications:  No current facility-administered medications for this encounter.   Current Outpatient Medications  Medication Sig Dispense Refill   ondansetron (ZOFRAN ODT) 4 MG disintegrating tablet Take 1 tablet (4 mg total) by mouth every 8 (eight) hours as needed for nausea or vomiting. 10 tablet 0    PTA Medications:  Facility Ordered Medications  Medication   [COMPLETED] LORazepam (ATIVAN) tablet 1 mg   [COMPLETED] potassium chloride (KLOR-CON) packet 40 mEq   PTA Medications  Medication Sig   ondansetron (ZOFRAN ODT) 4 MG disintegrating tablet Take 1 tablet (4 mg total) by mouth every 8 (eight) hours as needed for nausea or vomiting.        No data to display          Flowsheet Row ED from 10/08/2022 in [facility] ED from 10/07/2022 in [facility]  C-SSRS RISK CATEGORY  No Risk No Risk       Musculoskeletal  Strength & Muscle Tone: within normal limits Gait & Station: normal Patient leans: N/A  Psychiatric Specialty Exam  Presentation  General Appearance:  Appropriate for Environment  Eye Contact: Minimal  Speech: Clear and Coherent  Speech Volume: Normal  Handedness: Not assessed  Mood and Affect  Mood: Depressed  Affect: Restricted   Thought  Process  Thought Processes: Coherent  Descriptions of Associations:Intact  Orientation:Full (Time, Place and Person)  Thought Content:WDL  Diagnosis of Schizophrenia or Schizoaffective disorder in past: No    Hallucinations:Hallucinations: None  Ideas of Reference:None  Suicidal Thoughts:Suicidal Thoughts: No  Homicidal Thoughts:Homicidal Thoughts: No   Sensorium  Memory: Immediate Fair; Recent Fair  Judgment: Intact  Insight: Fair   Chartered certified accountant: Fair  Attention Span: Fair  Recall: Fair  Fund of Knowledge: Fair  Language: Fair   Psychomotor Activity  Psychomotor Activity: Psychomotor Activity: Decreased   Assets  Assets: Desire for Improvement; Housing; Physical Health   Sleep  Sleep: Sleep: Fair   Nutritional Assessment (For OBS and FBC admissions only) Has the patient had a weight loss or gain of 10 pounds or more in the last 3 months?: No Has the patient had a decrease in food intake/or appetite?: No Does the patient have dental problems?: No Does the patient have eating habits or behaviors that may be indicators of an eating disorder including binging or inducing vomiting?: No Has the patient recently lost weight without trying?: 0 Has the patient been eating poorly because of a decreased appetite?: 0 Malnutrition Screening Tool Score: 0    Physical Exam  Physical Exam Constitutional:      Appearance: Normal appearance.  HENT:     Head: Normocephalic and atraumatic.     Mouth/Throat:     Mouth: Mucous membranes are moist.  Eyes:     Extraocular Movements: Extraocular movements intact.  Cardiovascular:     Rate and Rhythm: Normal rate.  Pulmonary:     Effort: Pulmonary effort is normal.  Abdominal:     Palpations: Abdomen is soft.  Musculoskeletal:        General: Normal range of motion.     Cervical back: Normal range of motion.  Skin:    General: Skin is warm and dry.  Neurological:      General: No focal deficit present.     Mental Status: He is alert.  Psychiatric:        Attention and Perception: He does not perceive auditory or visual hallucinations.        Mood and Affect: Mood is depressed. Affect is flat.        Behavior: Behavior is slowed.        Thought Content: Thought content does not include homicidal or suicidal ideation.    Review of Systems  Constitutional:  Positive for diaphoresis.  HENT: Negative.    Eyes: Negative.   Respiratory: Negative.    Cardiovascular:  Negative for chest pain and palpitations.  Gastrointestinal:  Positive for nausea.  Genitourinary: Negative.   Musculoskeletal: Negative.   Skin: Negative.   Neurological: Negative.   Endo/Heme/Allergies: Negative.   Psychiatric/Behavioral:  Positive for substance abuse.    Blood pressure 109/78, pulse (!) 108, temperature 98.5 F (36.9 C), temperature source Oral, resp. rate 18, SpO2 100%. There is no height or weight on file to calculate BMI.  Demographic Factors:  Male, Adolescent or young adult, and Low socioeconomic status  Loss Factors:  Financial problems/change in socioeconomic status  Historical Factors: Impulsivity  Risk Reduction Factors:   Living with another person, especially a relative  Continued Clinical Symptoms:  Alcohol/Substance Abuse/Dependencies  Cognitive Features That Contribute To Risk:  Thought constriction (tunnel vision)    Suicide Risk:  Mild: There are no identifiable plans, no associated intent, mild dysphoria and related symptoms, good self-control (both objective and subjective assessment), few other risk factors, and identifiable protective factors, including available and accessible social support.  Plan Of Care/Follow-up recommendations:  Activity: as tolerated  Diet: heart healthy  Other: -Follow-up with your outpatient psychiatric provider -instructions on appointment date, time, and address (location) are provided to you in discharge  paperwork.  -Take your psychiatric medications as prescribed at discharge - instructions are provided to you in the discharge paperwork  -Follow-up with outpatient primary care doctor and other specialists -for management of preventative medicine and chronic medical disease: N/A  -Testing: Follow-up with outpatient provider for abnormal lab results: hypokalemia  -If you are prescribed an atypical antipsychotic medication, we recommend that your outpatient psychiatrist follow routine screening for side effects within 3 months of discharge, including monitoring: AIMS scale, height, weight, blood pressure, fasting lipid panel, HbA1c, and fasting blood sugar.   -Recommend total abstinence from alcohol, tobacco, and other illicit drug use at discharge.   -If your psychiatric symptoms recur, worsen, or if you have side effects to your psychiatric medications, call your outpatient psychiatric provider, [phone] or go to the nearest emergency department.  -If suicidal thoughts occur, immediately call your outpatient psychiatric provider, 911, [phone] or go to the nearest emergency department.   Disposition: FBC  Karie Fetch, MD, PGY-2 10/08/2022, 1:19 PM

## 2022-10-08 NOTE — Discharge Instructions (Signed)
Admit to [facility]

## 2022-10-08 NOTE — ED Notes (Signed)
 Patient in the bedroom sleeping. NAD.  Respirations are even and unlabored. Will continue to monitor for safety.

## 2022-10-08 NOTE — ED Provider Notes (Signed)
Facility Based Crisis Admission H&P  Date: 10/08/22 Patient Name: Brett Vazquez MRN: [id number] Chief Complaint: "detox"  Diagnoses:  Final diagnoses:  Stimulant use disorder  Cannabis use disorder  Tobacco use disorder  Opioid use disorder   HPI:  Brett Vazquez is a 27 y.o., male with no past psychiatric history and substance use history of sedative, anxiolytic, and hypnotic, opioid, cannabis, and  use disorders  who presents to the Facility Based Crisis center from behavioral health urgent care [facility]) for evaluation and management of substance withdrawals.   Patient confirms 3 years of daily Xanax and fentanyl use. He reports last use of fentanyl "few days ago" and last use of Xanax "the other day." He also confirms cannabis use and smokes 1-2 packs of cigarettes per day.  He denies a past psychiatric history and reportedly does not take any psychotropic medications.  Patient interview was abbreviated due to profound somnolence.  PHQ 2-9:    Flowsheet Row ED from 10/08/2022 in [facility] ED from 10/07/2022 in [facility]  C-SSRS RISK CATEGORY No Risk No Risk        Total Time spent with patient: 1.5 hours  Musculoskeletal  Strength & Muscle Tone: within normal limits Gait & Station: normal Patient leans: N/A  Psychiatric Specialty Exam  Presentation General Appearance: Casual (somnolent)   Eye Contact:None   Speech:Clear and Coherent; Normal Rate   Speech Volume:Normal   Handedness:No data recorded  Mood and Affect  Mood:-- ("ok")   Affect:Flat; Appropriate   Thought Process  Thought Processes:Coherent   Descriptions of Associations:Intact   Orientation:Full (Time, Place and Person)   Thought Content:WDL  Diagnosis of Schizophrenia or Schizoaffective disorder in past: No    Hallucinations:Hallucinations: None   Ideas of Reference:None   Suicidal Thoughts:Suicidal  Thoughts: No   Homicidal Thoughts:Homicidal Thoughts: No   Sensorium  Memory:Immediate Good   Judgment:Fair   Insight:Lacking   Executive Functions  Concentration:Poor   Attention Span:Poor   Recall:Poor   Fund of Knowledge:Fair   Language:Fair   Psychomotor Activity  Psychomotor Activity:Psychomotor Activity: Psychomotor Retardation   Assets  Assets:Resilience   Sleep  Sleep:Sleep: Fair   Nutritional Assessment (For OBS and FBC admissions only) Has the patient had a weight loss or gain of 10 pounds or more in the last 3 months?: No Has the patient had a decrease in food intake/or appetite?: No Does the patient have dental problems?: No Does the patient have eating habits or behaviors that may be indicators of an eating disorder including binging or inducing vomiting?: No Has the patient recently lost weight without trying?: 0 Has the patient been eating poorly because of a decreased appetite?: 0 Malnutrition Screening Tool Score: 0    Physical Exam Vitals and nursing note reviewed.  HENT:     Head: Normocephalic and atraumatic.  Pulmonary:     Effort: Pulmonary effort is normal.  Musculoskeletal:     Cervical back: Normal range of motion.  Neurological:     General: No focal deficit present.     Mental Status: He is alert. Mental status is at baseline.    Review of Systems  Constitutional: Negative.   Respiratory: Negative.    Cardiovascular: Negative.   Gastrointestinal: Negative.   Genitourinary: Negative.   Psychiatric/Behavioral:         Psychiatric subjective data addressed in PSE or HPI / daily subjective report   There were no vitals taken for this visit. There is  no height or weight on file to calculate BMI.  Past Psychiatric History: fentanyl use, cannabis use, opioid use, benzodiazepine use   Is the patient at risk to self? No Has the patient been a risk to self in the past 6 months? No Has the patient been a risk to self  within the distant past? Unknown Is the patient a risk to others? No Has the patient been a risk to others in the past 6 months? No Has the patient been a risk to others within the distant past? Unknown  Past Medical History: none Family Psychiatric History: no family history of schizophrenia  Social History: lives with mom. Unemployed.  Last Labs:     Latest Ref Rng & Units 10/07/2022    7:33 PM 05/04/2018    9:58 PM 04/04/2015   10:15 AM  CMP  Glucose 70 - 99 mg/dL 90  78  92   BUN 6 - 20 mg/dL 5  7  7    Creatinine 0.61 - 1.24 mg/dL 0.86  5.78  4.69   Sodium 135 - 145 mmol/L 135  141  138   Potassium 3.5 - 5.1 mmol/L 2.9  3.6  3.6   Chloride 98 - 111 mmol/L 95  102  101   CO2 22 - 32 mmol/L 28  27  24    Calcium 8.9 - 10.3 mg/dL 9.9  9.2  9.9   Total Protein 6.5 - 8.1 g/dL 7.4  6.6  7.1   Total Bilirubin 0.3 - 1.2 mg/dL 0.4  0.5  0.2   Alkaline Phos 38 - 126 U/L 53  52  52   AST 15 - 41 U/L 18  59  24   ALT 0 - 44 U/L 18  55  13   CBC    Component Value Date/Time   WBC 6.2 10/07/2022 1933   RBC 4.92 10/07/2022 1933   HGB 14.6 10/07/2022 1933   HCT 43.3 10/07/2022 1933   PLT 281 10/07/2022 1933   MCV 88.0 10/07/2022 1933   MCH 29.7 10/07/2022 1933   MCHC 33.7 10/07/2022 1933   RDW 12.8 10/07/2022 1933   LYMPHSABS 2.0 10/07/2022 1933   MONOABS 0.7 10/07/2022 1933   EOSABS 0.1 10/07/2022 1933   BASOSABS 0.0 10/07/2022 1933    Allergies: Patient has no known allergies.  PTA Medications: (Not in a hospital admission)   Long Term Goals: Improvement in symptoms so as ready for discharge  Short Term Goals: Patient will verbalize feelings in meetings with treatment team members. and Patient will take medications as prescribed daily.  Medical Decision Making               -- CIWA with scheduled chlordiazepoxide and as needed lorazepam protocol              -- COWS with as needed clonidine protocol              -- Infectious disease screening for high-risk behaviors --  Patient in need of nicotine replacement; nicotine polacrilex (gum) and nicotine patch 14 mg / 24 hours ordered. Smoking cessation encouraged  PRNs              -- start acetaminophen 650 mg every 6 hours as needed for mild to moderate pain, fever, and headaches              -- start hydroxyzine 25 mg three times a day as needed for anxiety              --  start bismuth subsalicylate 524 mg oral chewable tablet every 3 hours as needed for diarrhea / loose stools              -- start senna 8.6 mg oral at bedtime and polyethylene glycol 17 g oral daily as needed for mild to moderate constipation              -- start ondansetron 8 mg every 8 hours as needed for nausea or vomiting              -- start aluminum-magnesium hydroxide + simethicone 30 mL every 4 hours as needed for heartburn or indigestion              -- start melatonin 3 mg bedtime as needed for insomnia -- Opiate withdrawal supportive care: as needed loperamide, naproxen, dicyclomine, and methocarbamol   -- As needed agitation protocol in-place  Recommendations  Based on my evaluation the patient does not appear to have an emergency medical condition.  Augusto Gamble, MD 10/08/22  2:15 PM

## 2022-10-08 NOTE — ED Notes (Signed)
During assessment pt is lying in his bed and is calm and cooperative. He denies SI/HI/AVH. Patient reports sleep was "ok" last night. Patient informed of low potassium levels and the need to replenish. Pt voiced understanding. He denies any additional needs at this time. Will continue to monitor for safety.

## 2022-10-08 NOTE — ED Notes (Signed)
Resting in bed without issue.

## 2022-10-08 NOTE — ED Notes (Signed)
Patient observed resting quietly, eyes closed. Respirations equal and unlabored. Will continue to monitor for safety.  

## 2022-10-08 NOTE — ED Notes (Signed)
Patient admitted to [facility] from [facility] for detox from IV fentanyl use.  Patient reports last use 2 days ago.  He has runny nose otherwise no evidence of opiate withdrawal at this time.  Patient is calm and cooperative.  He was oriented to unit and shown to his room.  Patient then given lunch.  Patient encouraged to make needs known to staff.  Will monitor.

## 2022-10-09 DIAGNOSIS — F1590 Other stimulant use, unspecified, uncomplicated: Secondary | ICD-10-CM

## 2022-10-09 NOTE — ED Provider Notes (Signed)
Behavioral Health Progress Note  Date and Time: 10/09/2022 8:51 AM Name: Brett Vazquez MRN:  [id number]  Subjective: Patient says he feels "okay." He says he does not feel tired. He says he is not depressed. He denies experiencing any cravings for any substances at this time.  Patient does not endorse any side-effects they attribute to medications. Patient does not endorse any somatic complaints  He remains interested in residential rehab placement.  Diagnosis:  Final diagnoses:  Stimulant use disorder  Cannabis use disorder  Tobacco use disorder  Opioid use disorder    Total Time spent with patient: 45 minutes   Past Psychiatric History: fentanyl use, cannabis use, Xanax abuse  Past Medical History: none Family Psychiatric History: no family history of schizophrenia  Social History: lives with mom. Unemployed.  Sleep: Good  Appetite: Good  Current Medications: Current Facility-Administered Medications  Medication Dose Route Frequency Provider Last Rate Last Admin   acetaminophen (TYLENOL) tablet 650 mg  650 mg Oral Q6H PRN Augusto Gamble, MD       alum & mag hydroxide-simeth (MAALOX/MYLANTA) 200-200-20 MG/5ML suspension 30 mL  30 mL Oral Q4H PRN Augusto Gamble, MD       bismuth subsalicylate (PEPTO BISMOL) chewable tablet 524 mg  524 mg Oral Q3H PRN Augusto Gamble, MD       chlordiazePOXIDE (LIBRIUM) capsule 25 mg  25 mg Oral QID Augusto Gamble, MD   25 mg at 10/08/22 2128   Followed by   chlordiazePOXIDE (LIBRIUM) capsule 25 mg  25 mg Oral TID Augusto Gamble, MD       Followed by   Melene Muller ON 10/10/2022] chlordiazePOXIDE (LIBRIUM) capsule 25 mg  25 mg Oral Q12H Augusto Gamble, MD       Followed by   Melene Muller ON 10/11/2022] chlordiazePOXIDE (LIBRIUM) capsule 25 mg  25 mg Oral Once Augusto Gamble, MD       cloNIDine (CATAPRES) tablet 0.1 mg  0.1 mg Oral Q4H PRN Augusto Gamble, MD       dicyclomine (BENTYL) tablet 20 mg  20 mg Oral Q6H PRN Augusto Gamble, MD       haloperidol (HALDOL) tablet 5 mg  5  mg Oral Q6H PRN Augusto Gamble, MD       And   LORazepam (ATIVAN) tablet 1 mg  1 mg Oral Q6H PRN Augusto Gamble, MD       And   diphenhydrAMINE (BENADRYL) capsule 25 mg  25 mg Oral Q6H PRN Augusto Gamble, MD       haloperidol lactate (HALDOL) injection 5 mg  5 mg Intramuscular Q6H PRN Augusto Gamble, MD       And   LORazepam (ATIVAN) injection 1 mg  1 mg Intravenous Q6H PRN Augusto Gamble, MD       And   diphenhydrAMINE (BENADRYL) injection 25 mg  25 mg Intramuscular Q6H PRN Augusto Gamble, MD       hydrOXYzine (ATARAX) tablet 25 mg  25 mg Oral TID PRN Augusto Gamble, MD   25 mg at 10/08/22 2128   loperamide (IMODIUM) capsule 2-4 mg  2-4 mg Oral PRN Augusto Gamble, MD       LORazepam (ATIVAN) tablet 1 mg  1 mg Oral Q4H PRN Augusto Gamble, MD       Or   LORazepam (ATIVAN) injection 1 mg  1 mg Intramuscular Q4H PRN Augusto Gamble, MD       melatonin tablet 3 mg  3 mg Oral QHS PRN Augusto Gamble, MD  3 mg at 10/08/22 2128   methocarbamol (ROBAXIN) tablet 500 mg  500 mg Oral Q8H PRN Augusto Gamble, MD       thiamine (VITAMIN B1) tablet 100 mg  100 mg Oral Daily Augusto Gamble, MD   100 mg at 10/08/22 1457   And   multivitamin with minerals tablet 1 tablet  1 tablet Oral Daily Augusto Gamble, MD       naproxen (NAPROSYN) tablet 500 mg  500 mg Oral BID PRN Augusto Gamble, MD       nicotine (NICODERM CQ - dosed in mg/24 hours) patch 14 mg  14 mg Transdermal Daily PRN Augusto Gamble, MD       nicotine polacrilex (NICORETTE) gum 2 mg  2 mg Oral PRN Augusto Gamble, MD       ondansetron (ZOFRAN-ODT) disintegrating tablet 8 mg  8 mg Oral Q8H PRN Augusto Gamble, MD       polyethylene glycol (MIRALAX / GLYCOLAX) packet 17 g  17 g Oral Daily PRN Augusto Gamble, MD       senna (SENOKOT) tablet 8.6 mg  1 tablet Oral QHS PRN Augusto Gamble, MD       Current Outpatient Medications  Medication Sig Dispense Refill   ondansetron (ZOFRAN ODT) 4 MG disintegrating tablet Take 1 tablet (4 mg total) by mouth every 8 (eight) hours as needed for nausea or vomiting. 10  tablet 0    Labs  Lab Results:     Latest Ref Rng & Units 10/07/2022    7:33 PM 05/04/2018    9:58 PM 04/04/2015   10:15 AM  CBC  WBC 4.0 - 10.5 K/uL 6.2  13.7  4.1   Hemoglobin 13.0 - 17.0 g/dL 16.1  09.6  04.5   Hematocrit 39.0 - 52.0 % 43.3  43.3  43.8   Platelets 150 - 400 K/uL 281  260  187       Latest Ref Rng & Units 10/07/2022    7:33 PM 05/04/2018    9:58 PM 04/04/2015   10:15 AM  CMP  Glucose 70 - 99 mg/dL 90  78  92   BUN 6 - 20 mg/dL 5  7  7    Creatinine 0.61 - 1.24 mg/dL 4.09  8.11  9.14   Sodium 135 - 145 mmol/L 135  141  138   Potassium 3.5 - 5.1 mmol/L 2.9  3.6  3.6   Chloride 98 - 111 mmol/L 95  102  101   CO2 22 - 32 mmol/L 28  27  24    Calcium 8.9 - 10.3 mg/dL 9.9  9.2  9.9   Total Protein 6.5 - 8.1 g/dL 7.4  6.6  7.1   Total Bilirubin 0.3 - 1.2 mg/dL 0.4  0.5  0.2   Alkaline Phos 38 - 126 U/L 53  52  52   AST 15 - 41 U/L 18  59  24   ALT 0 - 44 U/L 18  55  13     Blood Alcohol level:  Lab Results  Component Value Date   ETH <10 10/07/2022   Metabolic Disorder Labs: Lab Results  Component Value Date   HGBA1C 5.4 10/07/2022   MPG 108.28 10/07/2022   No results found for: "PROLACTIN" Lab Results  Component Value Date   CHOL 112 10/07/2022   TRIG 57 10/07/2022   HDL 40 (L) 10/07/2022   CHOLHDL 2.8 10/07/2022   VLDL 11 10/07/2022   LDLCALC 61 10/07/2022   Therapeutic Lab  Levels: No results found for: "LITHIUM" No results found for: "VALPROATE" No results found for: "CBMZ" Physical Findings   PHQ2-9    Flowsheet Row ED from 10/08/2022 in [facility]  PHQ-2 Total Score 0      Flowsheet Row ED from 10/08/2022 in [facility] ED from [facility]07/2022 in [facility]  C-SSRS RISK CATEGORY No Risk No Risk       Musculoskeletal  Strength & Muscle Tone: within normal limits Gait & Station: normal Patient leans: N/A  Psychiatric Specialty Exam  Presentation   General Appearance: Casual   Eye Contact:None   Speech:Clear and Coherent   Speech Volume:Normal   Handedness:No data recorded  Mood and Affect  Mood:-- ("okay")   Affect:Non-Congruent; Flat; Appropriate ("dysphorric")   Thought Process  Thought Processes:-- (concrete)   Descriptions of Associations:Intact   Orientation:Full (Time, Place and Person)   Thought Content:WDL  Diagnosis of Schizophrenia or Schizoaffective disorder in past: No    Hallucinations:Hallucinations: None   Ideas of Reference:None   Suicidal Thoughts:Suicidal Thoughts: No   Homicidal Thoughts:Homicidal Thoughts: No   Sensorium  Memory:Immediate Fair   Judgment:Good   Insight:Lacking   Executive Functions  Concentration:Fair   Attention Span:Fair   Recall:Fair   Fund of Knowledge:Fair   Language:Fair   Psychomotor Activity  Psychomotor Activity:Psychomotor Activity: Psychomotor Retardation   Assets  Assets:Resilience   Sleep  Sleep:Sleep: Good   Nutritional Assessment (For OBS and [facility]BC admissions only) Has the patient had a weight loss or gain of 10 pounds or more in the last 3 months?: No Has the patient had a decrease in food intake/or appetite?: No Does the patient have dental problems?: No Does the patient have eating habits or behaviors that may be indicators of an eating disorder including binging or inducing vomiting?: No Has the patient recently lost weight without trying?: 0 Has the patient been eating poorly because of a decreased appetite?: 0 Malnutrition Screening Tool Score: 0    Physical Exam  Physical Exam Vitals and nursing note reviewed.  HENT:     Head: Normocephalic and atraumatic.  Pulmonary:     Effort: Pulmonary effort is normal.  Musculoskeletal:     Cervical back: Normal range of motion.  Neurological:     General: No focal deficit present.     Mental Status: He is alert. Mental status is at baseline.    Review  of Systems  Constitutional: Negative.   Respiratory: Negative.    Cardiovascular: Negative.   Gastrointestinal: Negative.   Genitourinary: Negative.   Psychiatric/Behavioral:         Psychiatric subjective data addressed in PSE or HPI / daily subjective report   Blood pressure 116/83, pulse 66, temperature 98.3 F (36.8 C), temperature source Oral, resp. rate 18, SpO2 100%. There is no height or weight on file to calculate BMI.  Treatment Plan Summary: Daily contact with patient to assess and evaluate symptoms and progress in treatment and Medication management:              -- CIWA with scheduled chlordiazepoxide and as needed lorazepam protocol              -- COWS with as needed clonidine protocol              -- Infectious disease screening for high-risk behaviors -- Patient in need of nicotine replacement; nicotine polacrilex (gum) and nicotine patch 14 mg / 24 hours ordered. Smoking cessation encouraged  PRNs              -- continue acetaminophen 650 mg every 6 hours as needed for mild to moderate pain, fever, and headaches              -- continue hydroxyzine 25 mg three times a day as needed for anxiety              -- continue bismuth subsalicylate 524 mg oral chewable tablet every 3 hours as needed for diarrhea / loose stools              -- continue senna 8.6 mg oral at bedtime and polyethylene glycol 17 g oral daily as needed for mild to moderate constipation              -- continue ondansetron 8 mg every 8 hours as needed for nausea or vomiting              -- continue aluminum-magnesium hydroxide + simethicone 30 mL every 4 hours as needed for heartburn or indigestion              -- continue melatonin 3 mg at bedtime as needed for insomnia -- Opiate withdrawal supportive care: as needed loperamide, naproxen, dicyclomine, and methocarbamol  -- As needed agitation protocol in-place  Augusto Gamble, MD 10/09/22 8:51 AM

## 2022-10-09 NOTE — ED Notes (Signed)
 Patient in the bedroom sleeping. NAD.  Respirations are even and unlabored. Will continue to monitor for safety.

## 2022-10-09 NOTE — ED Notes (Signed)
Patient in the dayroom eating dinner.

## 2022-10-09 NOTE — ED Notes (Signed)
Patient refused blood work and other care this morning. Will inform incoming staff.

## 2022-10-09 NOTE — Group Note (Signed)
Group Topic: Relapse and Recovery  Group Date: 10/09/2022 Start Time: 2000 End Time: 2100 Facilitators: Vassie Loll, RN  Department: [facility]  Number of Participants: 3  Group Focus: relapse prevention Treatment Modality:  Psychoeducation Interventions utilized were support Purpose: relapse prevention strategies  Name: Brett Vazquez Date of Birth: 20-Oct-1995  MR: [id number]    Level of Participation: minimal Quality of Participation: drowsy Interactions with others: n/a Mood/Affect: appropriate Triggers (if applicable): n/a Cognition: coherent/clear Progress: Moderate Response: n/a Plan: follow-up needed  Patients Problems:  Patient Active Problem List   Diagnosis Date Noted   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

## 2022-10-09 NOTE — Care Management (Addendum)
[facility] Care Management   Writer referred patient to [facility], [facility], [facility] [facility].  3:50pm  [employer] states that the patient has been tentatively accepted pending a telephone intake sceening.    Writer provided the patient with the phone number to call [phone].

## 2022-10-09 NOTE — ED Notes (Signed)
Patient observed resting quietly, eyes closed. Respirations equal and unlabored. Will continue to monitor for safety.  

## 2022-10-09 NOTE — ED Notes (Signed)
Pt requested for medication to help with sleep.

## 2022-10-09 NOTE — ED Notes (Signed)
Patient is calm and cooperative. He denies SI/HI/AVH. Patient reports sleep was not good last night. This nurse attempted to draw ordered labs and [place] declined. He states "the needle hurts". I have attempted several times today to persuade patient to allow me to draw labs and patient continues to decline. Patient denies any needs at this time. Will continue to monitor for safety.

## 2022-10-09 NOTE — ED Notes (Addendum)
This nurse asked pt if he could come with me to obtain an EKG. Patient refused. Patient then stated "I just want to go home". "I'm tired of all these tests".This nurse educated pt of reasons the test are needed and the importance of having them completed. Patient refused again and stated "I just want to go home". This nurse asked if patient was 100% sure that this is what he wanted and patient stated "yes". He then came out of the room and placed a call for someone to come pick him up. Will make oncoming shift aware of pt's request.

## 2022-10-09 NOTE — ED Notes (Signed)
Patient observed/assessed at bedside lying in bed asleep. Patient alert and oriented to self and location. Affect is flat and patient seems agitated to be disturbed from sleeping. Patient denies pain and anxiety. He denies A/V/H. He denies having any thoughts/plan of self harm and harm towards others. Fluid and snack offered. Patient states that appetite has been good throughout the day. Verbalizes no further complaints at this time. Will continue to monitor and support.

## 2022-10-09 NOTE — ED Notes (Signed)
Lunch provided.

## 2022-10-10 DIAGNOSIS — F1590 Other stimulant use, unspecified, uncomplicated: Secondary | ICD-10-CM | POA: Diagnosis not present

## 2022-10-10 MED ORDER — NICOTINE POLACRILEX 2 MG MT GUM: 2.0000 mg | CHEWING_GUM | OROMUCOSAL | 0 refills | Status: AC | PRN

## 2022-10-10 MED ORDER — NICOTINE 14 MG/24HR TD PT24: 14.0000 mg | MEDICATED_PATCH | Freq: Every day | TRANSDERMAL | 0 refills | Status: AC | PRN

## 2022-10-10 NOTE — ED Notes (Signed)
 Patient A&Ox4. Denies intent to harm self/others when asked. Denies A/VH. Patient denies any physical complaints when asked. No acute distress noted. Support and encouragement provided. Routine safety checks conducted according to facility protocol. Encouraged patient to notify staff if thoughts of harm toward self or others arise. Patient verbalized understanding and agreement. Will continue to monitor for safety.

## 2022-10-10 NOTE — ED Notes (Signed)
Patient is sleeping. Respirations equal and unlabored, skin warm and dry. No change in assessment or acuity. Routine safety checks conducted according to facility protocol. Will continue to monitor for safety.   

## 2022-10-10 NOTE — ED Notes (Signed)
 Pt discharged. Verbalized understanding of education. Personal belongings returned. Escorted out via nurse. Safety maintained.

## 2022-10-10 NOTE — ED Provider Notes (Signed)
FBC/OBS ASAP Discharge Summary  Date: 10/10/22 Name: Brett Vazquez MRN: [id number]  Discharge Diagnoses:  Final diagnoses:  Stimulant use disorder  Cannabis use disorder  Tobacco use disorder  Opioid use disorder    Brett Vazquez is a 27 y.o., male with no past psychiatric history and substance use history of sedative, anxiolytic, and hypnotic, opioid, cannabis, and  use disorders  who presents to the [facility] center from behavioral health urgent care [facility]) for evaluation and management of substance withdrawals.   Subjective: Patient says he wants to leave - he says his mom is here to pick him up. He is aware he is still being given the chlordiazepoxide taper and he could experience life-threatening withdrawal symptoms if he chooses to discontinue treatment abruptly. Patient expressed understanding and still wishes to leave despite these risks. Patient also made aware that his potassium was low and we attempted to recheck (which he refused) after giving him potassium replacement. He was made aware he will need to follow-up with his PCP to which he expressed understanding.  Patient denies having suicidal or homicidal thoughts. He is not hearing any voices others don't hear or seeing things others don't see. He denies experiencing any withdrawal symptoms.  Stay Summary: During the patient's hospitalization, patient had extensive initial psychiatric evaluation, and follow-up psychiatric evaluations every day.  The following meds were provided and managed during his stay. Scheduled Meds:  chlordiazePOXIDE  25 mg Oral TID   Followed by   chlordiazePOXIDE  25 mg Oral Q12H   Followed by   Melene Muller ON 10/11/2022] chlordiazePOXIDE  25 mg Oral Once   thiamine  100 mg Oral Daily   And   multivitamin with minerals  1 tablet Oral Daily   Continuous Infusions: PRN Meds:.acetaminophen, alum & mag hydroxide-simeth, bismuth subsalicylate, cloNIDine, dicyclomine, haloperidol  **AND** LORazepam **AND** diphenhydrAMINE, haloperidol lactate **AND** LORazepam **AND** diphenhydrAMINE, hydrOXYzine, loperamide, LORazepam **OR** LORazepam, melatonin, methocarbamol, naproxen, nicotine, nicotine polacrilex, ondansetron, polyethylene glycol, senna  Patient's care was discussed during the interdisciplinary team meeting every day during the hospitalization.  The patient denies any side effects to prescribed psychiatric medication.  Gradually, patient started adjusting to milieu. The patient was evaluated each day by a clinical provider to ascertain response to treatment. Improvement was noted by the patient's report of decreasing symptoms, improved sleep and appetite, affect, medication tolerance, behavior, and participation in unit programming.  Patient was asked each day to complete a self inventory noting mood, mental status, pain, new symptoms, anxiety and concerns.   Symptoms were reported as significantly decreased or resolved completely by discharge.  The patient reports that their mood is stable.  The patient denied having suicidal thoughts for more than 48 hours prior to discharge.  Patient denies having homicidal thoughts.  Patient denies having auditory hallucinations.  Patient denies any visual hallucinations or other symptoms of psychosis.  The patient was motivated to continue taking medication with a goal of continued improvement in mental health.   Symptoms were reported as significantly decreased or resolved completely by discharge.   On day of discharge, the patient reports that their mood is stable. The patient denied having suicidal thoughts for more than 48 hours prior to discharge.  Patient denies having homicidal thoughts.  Patient denies having auditory hallucinations.  Patient denies any visual hallucinations or other symptoms of psychosis. The patient was motivated to continue taking medication with a goal of continued improvement in mental health.   The  patient reports their target psychiatric  symptoms of substance withdrawals responded well to the psychiatric medications, and the patient reports overall benefit other psychiatric hospitalization. Supportive psychotherapy was provided to the patient. The patient also participated in regular group therapy while hospitalized. Coping skills, problem solving as well as relaxation therapies were also part of the unit programming.  Labs were reviewed with the patient, and abnormal results were discussed with the patient.  The patient is able to verbalize their individual safety plan to this provider.  # It is recommended to the patient to continue psychiatric medications as prescribed, after discharge from the hospital.    # It is recommended to the patient to follow up with your outpatient psychiatric provider and PCP.  # It was discussed with the patient, the impact of alcohol, drugs, tobacco have been there overall psychiatric and medical wellbeing, and total abstinence from substance use was recommended the patient.ed.  # Prescriptions provided or sent directly to preferred pharmacy at discharge. Patient agreeable to plan. Given opportunity to ask questions. Appears to feel comfortable with discharge.    # In the event of worsening symptoms, the patient is instructed to call the crisis hotline, 911 and or go to the nearest ED for appropriate evaluation and treatment of symptoms. To follow-up with primary care provider for other medical issues, concerns and or health care needs  # Patient was discharged home with plans to follow up provided in the discharge instructions.  On day of discharge patient is not suicidal or homicidal. He is not exhibiting bizarre behavior nor endorsing auditory or visual hallucinations. He is not exhibiting any life-threatening substance withdrawal symptoms. At present, there are no indications to hold patient involuntarily.  Total Time spent with patient: 45  minutes  Past Psychiatric History: fentanyl use, cannabis use, opioid use, benzodiazepine use  Past Medical History: none Family Psychiatric History: no family history of schizophrenia  Social History: lives with mom. Unemployed. Tobacco Cessation:  A prescription for an FDA-approved tobacco cessation medication provided at discharge  Current Medications:  Current Facility-Administered Medications  Medication Dose Route Frequency Provider Last Rate Last Admin   acetaminophen (TYLENOL) tablet 650 mg  650 mg Oral Q6H PRN Augusto Gamble, MD       alum & mag hydroxide-simeth (MAALOX/MYLANTA) 200-200-20 MG/5ML suspension 30 mL  30 mL Oral Q4H PRN Augusto Gamble, MD       bismuth subsalicylate (PEPTO BISMOL) chewable tablet 524 mg  524 mg Oral Q3H PRN Augusto Gamble, MD       chlordiazePOXIDE (LIBRIUM) capsule 25 mg  25 mg Oral TID Augusto Gamble, MD   25 mg at 10/09/22 2200   Followed by   chlordiazePOXIDE (LIBRIUM) capsule 25 mg  25 mg Oral Q12H Augusto Gamble, MD       Followed by   Melene Muller ON 10/11/2022] chlordiazePOXIDE (LIBRIUM) capsule 25 mg  25 mg Oral Once Augusto Gamble, MD       cloNIDine (CATAPRES) tablet 0.1 mg  0.1 mg Oral Q4H PRN Augusto Gamble, MD       dicyclomine (BENTYL) tablet 20 mg  20 mg Oral Q6H PRN Augusto Gamble, MD       haloperidol (HALDOL) tablet 5 mg  5 mg Oral Q6H PRN Augusto Gamble, MD       And   LORazepam (ATIVAN) tablet 1 mg  1 mg Oral Q6H PRN Augusto Gamble, MD       And   diphenhydrAMINE (BENADRYL) capsule 25 mg  25 mg Oral Q6H PRN Augusto Gamble, MD  haloperidol lactate (HALDOL) injection 5 mg  5 mg Intramuscular Q6H PRN Augusto Gamble, MD       And   LORazepam (ATIVAN) injection 1 mg  1 mg Intravenous Q6H PRN Augusto Gamble, MD       And   diphenhydrAMINE (BENADRYL) injection 25 mg  25 mg Intramuscular Q6H PRN Augusto Gamble, MD       hydrOXYzine (ATARAX) tablet 25 mg  25 mg Oral TID PRN Augusto Gamble, MD   25 mg at 10/08/22 2128   loperamide (IMODIUM) capsule 2-4 mg  2-4 mg Oral PRN Augusto Gamble, MD       LORazepam (ATIVAN) tablet 1 mg  1 mg Oral Q4H PRN Augusto Gamble, MD       Or   LORazepam (ATIVAN) injection 1 mg  1 mg Intramuscular Q4H PRN Augusto Gamble, MD       melatonin tablet 3 mg  3 mg Oral QHS PRN Augusto Gamble, MD   3 mg at 10/09/22 2236   methocarbamol (ROBAXIN) tablet 500 mg  500 mg Oral Q8H PRN Augusto Gamble, MD       thiamine (VITAMIN B1) tablet 100 mg  100 mg Oral Daily Augusto Gamble, MD   100 mg at 10/09/22 0911   And   multivitamin with minerals tablet 1 tablet  1 tablet Oral Daily Augusto Gamble, MD   1 tablet at 10/09/22 0911   naproxen (NAPROSYN) tablet 500 mg  500 mg Oral BID PRN Augusto Gamble, MD       nicotine (NICODERM CQ - dosed in mg/24 hours) patch 14 mg  14 mg Transdermal Daily PRN Augusto Gamble, MD       nicotine polacrilex (NICORETTE) gum 2 mg  2 mg Oral PRN Augusto Gamble, MD   2 mg at 10/09/22 1413   ondansetron (ZOFRAN-ODT) disintegrating tablet 8 mg  8 mg Oral Q8H PRN Augusto Gamble, MD       polyethylene glycol (MIRALAX / GLYCOLAX) packet 17 g  17 g Oral Daily PRN Augusto Gamble, MD       senna (SENOKOT) tablet 8.6 mg  1 tablet Oral QHS PRN Augusto Gamble, MD       Current Outpatient Medications  Medication Sig Dispense Refill   nicotine (NICODERM CQ - DOSED IN MG/24 HOURS) 14 mg/24hr patch Place 1 patch (14 mg total) onto the skin daily as needed (nicotine cravings). 30 patch 0   nicotine polacrilex (NICORETTE) 2 MG gum Take 1 each (2 mg total) by mouth as needed for smoking cessation. 90 tablet 0    PTA Medications: (Not in a hospital admission)      10/10/2022    8:23 AM 10/08/2022    2:15 PM  Depression screen PHQ 2/9  Decreased Interest 0 0  Down, Depressed, Hopeless 0 0  PHQ - 2 Score 0 0    Flowsheet Row ED from 10/08/2022 in [facility] ED from 10/07/2022 in [facility]  C-SSRS RISK CATEGORY No Risk No Risk       Musculoskeletal  Strength & Muscle Tone: within normal limits Gait & Station:  normal Patient leans: N/A  Psychiatric Specialty Exam  Presentation General Appearance: Casual   Eye Contact:Good   Speech:Clear and Coherent; Normal Rate   Speech Volume:Decreased   Handedness:No data recorded  Mood and Affect  Mood:-- ("good")   Affect:Flat; Non-Congruent; Appropriate   Thought Process  Thought Processes:Linear (concrete)   Descriptions of Associations:Intact   Orientation:Full (  Time, Place and Person)   Thought Content:WDL   Diagnosis of Schizophrenia or Schizoaffective disorder in past: No    Hallucinations:Hallucinations: None   Ideas of Reference:None   Suicidal Thoughts:Suicidal Thoughts: No   Homicidal Thoughts:Homicidal Thoughts: No   Sensorium  Memory:Immediate Good; Recent Good   Judgment:Poor   Insight:Poor   Executive Functions  Concentration:Good   Attention Span:Good   Recall:Fair   Fund of Knowledge:Fair   Language:Fair   Psychomotor Activity  Psychomotor Activity:Psychomotor Activity: Normal   Assets  Assets:Resilience   Sleep  Sleep:Sleep: Good   No data recorded  Physical Exam  Physical Exam Vitals and nursing note reviewed.  HENT:     Head: Normocephalic and atraumatic.  Pulmonary:     Effort: Pulmonary effort is normal.  Musculoskeletal:     Cervical back: Normal range of motion.  Neurological:     General: No focal deficit present.     Mental Status: He is alert. Mental status is at baseline.    Review of Systems  Constitutional: Negative.   Respiratory: Negative.    Cardiovascular: Negative.   Gastrointestinal: Negative.   Genitourinary: Negative.   Psychiatric/Behavioral:         Psychiatric subjective data addressed in PSE or HPI / daily subjective report   Blood pressure 97/68, pulse 98, temperature 98.9 F (37.2 C), temperature source Oral, resp. rate 18, SpO2 100%. There is no height or weight on file to calculate BMI.  Demographic Factors:  Male and  Unemployed  Loss Factors: Financial problems/change in socioeconomic status  Historical Factors: NA  Risk Reduction Factors:   Living with another person, especially a relative and Positive social support  Continued Clinical Symptoms:  Alcohol/Substance Abuse/Dependencies More than one psychiatric diagnosis  Cognitive Features That Contribute To Risk:  None    Suicide Risk:  Mild:  Suicidal ideation of limited frequency, intensity, duration, and specificity.  There are no identifiable plans, no associated intent, mild dysphoria and related symptoms, good self-control (both objective and subjective assessment), few other risk factors, and identifiable protective factors, including available and accessible social support.  Plan Of Care/Follow-up recommendations:  Activity: as tolerated  Diet: heart healthy  Other: -Follow-up with your outpatient psychiatric provider -instructions on appointment date, time, and address (location) are provided to you in discharge paperwork.  -Take your psychiatric medications as prescribed at discharge - instructions are provided to you in the discharge paperwork  -Follow-up with outpatient primary care doctor and other specialists -for management of chronic medical disease, including: health maintenance checks and hypokalemia  -Testing: Follow-up with outpatient provider for abnormal lab results: recheck potassium  -Recommend abstinence from alcohol, tobacco, and other illicit drug use at discharge.   -If your psychiatric symptoms recur, worsen, or if you have side effects to your psychiatric medications, call your outpatient psychiatric provider, 911, [phone] or go to the nearest emergency department.  -If suicidal thoughts recur, call your outpatient psychiatric provider, 911, [phone] or go to the nearest emergency department.  Disposition: home / self-care with outpatient resources provided  Augusto Gamble, MD 10/10/2022, 8:29 AM

## 2022-10-10 NOTE — Discharge Instructions (Signed)
Dear Brett Vazquez,  It was a pleasure to take care of you during your stay at [facility] where you were treated for your Stimulant use disorder.  While you were here, you were:  observed and cared for by our nurses and nursing assistants  treated with medications by your psychiatrists  evaluated with imaging / lab tests, and treated with medicines / procedures by your doctors  provided individual and group therapy by therapists  provided resources by our social workers and case managers  Please review the medication list provided to you at discharge and stop, start taking, or continue taking the medications listed there.  You should also follow-up with your primary care doctor, or start seeing one if you don't have one yet. If applicable, here are some scheduled follow-ups for you:    I recommend abstinence from alcohol, tobacco, and other illicit drug use.   If your psychiatric symptoms or suicidal thoughts recur, worsen, or if you have side effects to your psychiatric medications, call your outpatient psychiatric provider, 911, 988 or go to the nearest emergency department.  Take care!  Signed: Augusto Gamble, MD 10/10/2022, 8:18 AM  For a list of more resources, see the following:  [facility] [address] [place], [place], [address] [phone] phone  New Patient Assessment/Therapy Walk-Ins:  Monday and Wednesday: 8 am until slots are full. Every 1st and 2nd Fridays of the month: 1 pm - 5 pm.  NO ASSESSMENT/THERAPY WALK-INS ON TUESDAYS OR THURSDAYS  New Patient Assessment/Medication Management Walk-Ins:  Monday - Friday:  8 am - 11 am.  For all walk-ins, we ask that you arrive by 7:30 am because patients will be seen in the order of arrival.  Availability is limited; therefore, you may not be seen on the same day that you walk-in.  Our goal is to serve and meet the needs of our community to the best of our ability.  12  STEP PROGRAMS:  Alcoholics Anonymous of [place] [employer]  Narcotics Anonymous of [place] [employer]  Al-Anon of [place], [place] www.[place]alanon.org/find-meetings.html  Nar-Anon https://nar-anon.org/find-a-meeting  Naloxone (Narcan) can help reverse an overdose when given to the victim quickly.  [facility] offers free naloxone kits and instructions/training on its use.  Add naloxone to your first aid kit and you can help save a life. A prescription can be filled at your local pharmacy or free kits are provided by the county.  Pick up your free kit at the following locations:   [place]:  [facility], [address] [place] [address] [phone]) [facility] [address] [place] [id number] [phone]) [facility] [address] [place] [address]  [place]: [facility] [address] [place] [address] ([phone]) [facility] [address] [place] [address] [phone])
